# Patient Record
Sex: Male | Born: 1980 | Race: White | Hispanic: No | Marital: Single | Smoking: Never smoker
Health system: Southern US, Community
[De-identification: ages and names within clinical notes are randomized; demographics above are authoritative.]

## PROBLEM LIST (undated history)

## (undated) DIAGNOSIS — F32A Depression, unspecified: Secondary | ICD-10-CM

## (undated) DIAGNOSIS — G4730 Sleep apnea, unspecified: Secondary | ICD-10-CM

## (undated) DIAGNOSIS — F329 Major depressive disorder, single episode, unspecified: Secondary | ICD-10-CM

## (undated) HISTORY — DX: Depression, unspecified: F32.A

## (undated) HISTORY — DX: Major depressive disorder, single episode, unspecified: F32.9

---

## 2009-09-04 ENCOUNTER — Emergency Department: Payer: Self-pay | Admitting: Emergency Medicine

## 2010-10-02 ENCOUNTER — Emergency Department (HOSPITAL_COMMUNITY): Admission: EM | Admit: 2010-10-02 | Discharge: 2010-10-02 | Payer: Self-pay | Admitting: Emergency Medicine

## 2010-10-06 ENCOUNTER — Observation Stay: Payer: Self-pay | Admitting: Internal Medicine

## 2016-08-24 ENCOUNTER — Ambulatory Visit (INDEPENDENT_AMBULATORY_CARE_PROVIDER_SITE_OTHER): Payer: BLUE CROSS/BLUE SHIELD | Admitting: Primary Care

## 2016-08-24 ENCOUNTER — Encounter: Payer: Self-pay | Admitting: Primary Care

## 2016-08-24 VITALS — BP 122/74 | HR 48 | Temp 97.9°F | Ht 69.75 in | Wt 183.1 lb

## 2016-08-24 DIAGNOSIS — R202 Paresthesia of skin: Secondary | ICD-10-CM | POA: Diagnosis not present

## 2016-08-24 DIAGNOSIS — R0683 Snoring: Secondary | ICD-10-CM

## 2016-08-24 LAB — CBC
HCT: 43.4 % (ref 39.0–52.0)
Hemoglobin: 14.8 g/dL (ref 13.0–17.0)
MCHC: 34.2 g/dL (ref 30.0–36.0)
MCV: 87.6 fl (ref 78.0–100.0)
Platelets: 219 10*3/uL (ref 150.0–400.0)
RBC: 4.95 Mil/uL (ref 4.22–5.81)
RDW: 13.4 % (ref 11.5–15.5)
WBC: 4.9 10*3/uL (ref 4.0–10.5)

## 2016-08-24 LAB — BASIC METABOLIC PANEL
BUN: 13 mg/dL (ref 6–23)
CALCIUM: 8.8 mg/dL (ref 8.4–10.5)
CHLORIDE: 105 meq/L (ref 96–112)
CO2: 27 meq/L (ref 19–32)
Creatinine, Ser: 1.05 mg/dL (ref 0.40–1.50)
GFR: 86.31 mL/min (ref >60.00)
Glucose, Bld: 84 mg/dL (ref 70–99)
Potassium: 3.9 mEq/L (ref 3.5–5.1)
SODIUM: 139 meq/L (ref 135–145)

## 2016-08-24 LAB — TSH: TSH: 2.11 u[IU]/mL (ref 0.35–4.50)

## 2016-08-24 LAB — VITAMIN B12: VITAMIN B 12: 357 pg/mL (ref 211–911)

## 2016-08-24 LAB — HEMOGLOBIN A1C: Hgb A1c MFr Bld: 5.6 % (ref 4.6–6.5)

## 2016-08-24 LAB — FOLATE: Folate: 12 ng/mL (ref >5.9)

## 2016-08-24 NOTE — Progress Notes (Signed)
Pre visit review using our clinic review tool, if applicable. No additional management support is needed unless otherwise documented below in the visit note. 

## 2016-08-24 NOTE — Progress Notes (Signed)
Subjective:    Patient ID: Vincent Harris, male    DOB: 10-28-1981, 35 y.o.   MRN: [id number]  HPI  Vincent Harris is a 35 year old male who presents today to establish care and discuss the problems mentioned below. Will obtain old records.  1) Tingling: His tingling is located to his fingertips and toes. History of tingling to the 4th and 5th digit of bilateral hands for years that will occur if he leans on his elbows for a prolonged period of time. This past Saturday he noticed tingling to his toes, and Monday morning he noticed noticed tingling to all of his finger tips. Denies discoloration of fingers/toes, neck pain, shoulder pain, numbness, recent injury/trauma. He exercises with Crossfit 5 days weekly, and has been doing so for the past 5 years. He has a long history of his finger tips and toes being much cooler than the rest of his body.  2) Snoring: Long history of snoring, occasionally wakes during the night, and never wakes up feeling refreshed. He doesn't ever feel like he falls completely asleep. He goes to bed between 11pm-1 am and will wake up between 6:30am-7:30am. He does not watch TV, read, or play on his phone prior to bed. He symptoms of anxiety and depression. He has a strong FH of sleep apnea in his grandfather, father, and brother.   Review of Systems  Respiratory: Negative for shortness of breath.   Musculoskeletal: Negative for back pain, joint swelling, myalgias and neck pain.  Neurological: Negative for numbness.       Tingling to finger tips and toes  Psychiatric/Behavioral: Positive for sleep disturbance. The patient is not nervous/anxious.        Past Medical History:  Diagnosis Date  . Depression      Social History   Social History  . Marital status: Married    Spouse name: N/A  . Number of children: N/A  . Years of education: N/A   Occupational History  . Not on file.   Social History Main Topics  . Smoking status: Never Smoker  . Smokeless  tobacco: Never Used  . Alcohol use Yes     Comment: soical  . Drug use: Unknown  . Sexual activity: Not on file   Other Topics Concern  . Not on file   Social History Narrative   Married.   2 daughters.   Self Employed.   Enjoys racing cars, spending time with family.    No past surgical history on file.  Family History  Problem Relation Age of Onset  . Mental illness Mother   . Diabetes Father   . Dementia Father   . Sleep apnea Father   . Arthritis Maternal Grandmother   . Arthritis Maternal Grandfather   . Diabetes Paternal Grandmother   . Diabetes Paternal Grandfather     Allergies  Allergen Reactions  . Percocet [Oxycodone-Acetaminophen] Other (See Comments)    seizure    No current outpatient prescriptions on file prior to visit.   No current facility-administered medications on file prior to visit.     BP 122/74   Pulse (!) 48   Temp 97.9 F (36.6 C) (Oral)   Ht 5' 9.75" (1.772 m)   Wt 183 lb 1.9 oz (83.1 kg)   SpO2 98%   BMI 26.46 kg/m    Objective:   Physical Exam  Constitutional: He is oriented to person, place, and time. He appears well-nourished.  Neck: Neck supple.  Cardiovascular: Normal rate  and regular rhythm.   Pulmonary/Chest: Effort normal and breath sounds normal. He has no wheezes. He has no rales.  Neurological: He is alert and oriented to person, place, and time.  Skin: Skin is warm and dry.  Psychiatric: He has a normal mood and affect.          Assessment & Plan:

## 2016-08-24 NOTE — Assessment & Plan Note (Signed)
Long history of snoring, wakes during the night, doesn't feel as he's slept most of the night.  ESS scale of 8 today. Doesn't fit the clinical picture of sleep apnea, however given family history and patient persistentcy for sleep study, will order. Referral placed to pulmonology for further evaluation.

## 2016-08-24 NOTE — Assessment & Plan Note (Signed)
To fingertips and toes x 1 week. Exam with cool feeling finger tips and toes, no discoloration. Suspect circulatory cause for tingling and cool temperatures, but will rule out other causes with lab work. Information provided regarding Raynaud's Phenomenon.

## 2016-08-24 NOTE — Patient Instructions (Signed)
Complete lab work prior to leaving today. I will notify you of your results once received.   You will be contacted regarding your referral to Pulmonology regarding the sleep study.  Please let us know if you have not heard back within one week.   It was a pleasure to meet you today! Please don't hesitate to call me with any questions. Welcome to [facility]!  Raynaud Phenomenon Raynaud phenomenon is a condition that affects the blood vessels (arteries) that carry blood to your fingers and toes. The arteries that supply blood to your ears or the tip of your nose might also be affected. Raynaud phenomenon causes the arteries to temporarily narrow. As a result, the flow of blood to the affected areas is temporarily decreased. This usually occurs in response to cold temperatures or stress. During an attack, the skin in the affected areas turns white. You may also feel tingling or numbness in those areas. Attacks usually last for only a brief period, and then the blood flow to the area returns to normal. In most cases, Raynaud phenomenon does not cause serious health problems. CAUSES  For many people with this condition, the cause is not known. Raynaud phenomenon is sometimes associated with other diseases, such as scleroderma or lupus. RISK FACTORS Raynaud phenomenon can affect anyone, but it develops most often in people who are 28-65 years old. It affects more females than males. SIGNS AND SYMPTOMS Symptoms of Raynaud phenomenon may occur when you are exposed to cold temperatures or when you have emotional stress. The symptoms may last for a few minutes or up to several hours. They usually affect your fingers but may also affect your toes, ears, or the tip of your nose. Symptoms may include:  Changes in skin color. The skin in the affected areas will turn pale or white. The skin may then change from white to bluish to red as normal blood flow returns to the area.  Numbness, tingling, or pain in the  affected areas. In severe cases, sores may develop in the affected areas.  DIAGNOSIS  Your health care provider will do a physical exam and take your medical history. You may be asked to put your hands in cold water to check for a reaction to cold temperature. Blood tests may be done to check for other diseases or conditions. Your health care provider may also order a test to check the movement of blood through your arteries and veins (vascular ultrasound). TREATMENT  Treatment often involves making lifestyle changes and taking steps to control your exposure to cold temperatures. For more severe cases, medicine (calcium channel blockers) may be used to improve blood flow. Surgery is sometimes done to block the nerves that control the affected arteries, but this is rare. HOME CARE INSTRUCTIONS   Avoid exposure to cold by taking these steps:  If possible, stay indoors during cold weather.  When you go outside during cold weather, dress in layers and wear mittens, a hat, a scarf, and warm footwear.  Wear mittens or gloves when handling ice or frozen food.  Use holders for glasses or cans containing cold drinks.  Let warm water run for a while before taking a shower or bath.  Warm up the car before driving in cold weather.  If possible, avoid stressful and emotional situations. Exercise, meditation, and yoga may help you cope with stress. Biofeedback may be useful.  Do not use any tobacco products, including cigarettes, chewing tobacco, or electronic cigarettes. If you need help quitting,  ask your health care provider.  Avoid secondhand smoke.  Limit your use of caffeine. Switch to decaffeinated coffee, tea, and soda. Avoid chocolate.  Wear loose fitting socks and comfortable, roomy shoes.  Avoid vibrating tools and machinery.  Take medicines only as directed by your health care provider. SEEK MEDICAL CARE IF:   Your discomfort becomes worse despite lifestyle changes.  You develop  sores on your fingers or toes that do not heal.  Your fingers or toes turn black.  You have breaks in the skin on your fingers or toes.  You have a fever.  You have pain or swelling in your joints.  You have a rash.  Your symptoms occur on only one side of your body.   This information is not intended to replace advice given to you by your health care provider. Make sure you discuss any questions you have with your health care provider.   Document Released: 11/24/2000 Document Revised: 12/18/2014 Document Reviewed: 07/02/2014 Elsevier Interactive Patient Education [employer].

## 2016-08-25 ENCOUNTER — Ambulatory Visit (INDEPENDENT_AMBULATORY_CARE_PROVIDER_SITE_OTHER): Payer: BLUE CROSS/BLUE SHIELD | Admitting: Pulmonary Disease

## 2016-08-25 ENCOUNTER — Encounter: Payer: Self-pay | Admitting: Pulmonary Disease

## 2016-08-25 VITALS — BP 138/70 | HR 65 | Ht 70.0 in | Wt 187.0 lb

## 2016-08-25 DIAGNOSIS — G4710 Hypersomnia, unspecified: Secondary | ICD-10-CM | POA: Diagnosis not present

## 2016-08-25 DIAGNOSIS — R0683 Snoring: Secondary | ICD-10-CM

## 2016-08-25 DIAGNOSIS — G4733 Obstructive sleep apnea (adult) (pediatric): Secondary | ICD-10-CM

## 2016-08-27 NOTE — Progress Notes (Signed)
PULMONARY/SLEEP CONSULT NOTE  Requesting MD/Service: Alma Friendly, NP Date of initial consultation: 08/25/16 Reason for consultation: Suspected OSA  PT PROFILE: 89 M referred for evaluation of daytime sleepiness and heavy snoring with witnessed apneas.   HPI:  Symptoms as above. His wife has expressed concern over witnessed apneas. He scores 11 on the Epworth Scale. His current weight is 185# down from a peak weight of 210# 5 yrs ago. He denies nasal and sinus symptoms   Past Medical History:  Diagnosis Date  . Depression     History reviewed. No pertinent surgical history.  MEDICATIONS: I have reviewed all medications and confirmed regimen as documented  Social History   Social History  . Marital status: Married    Spouse name: N/A  . Number of children: N/A  . Years of education: N/A   Occupational History  . Not on file.   Social History Main Topics  . Smoking status: Never Smoker  . Smokeless tobacco: Never Used  . Alcohol use Yes     Comment: soical  . Drug use: Unknown  . Sexual activity: Not on file   Other Topics Concern  . Not on file   Social History Narrative   Married.   2 daughters.   Self Employed.   Enjoys racing cars, spending time with family.    Family History  Problem Relation Age of Onset  . Mental illness Mother   . Diabetes Father   . Dementia Father   . Sleep apnea Father   . Arthritis Maternal Grandmother   . Arthritis Maternal Grandfather   . Diabetes Paternal Grandmother   . Diabetes Paternal Grandfather     ROS: No fever, myalgias/arthralgias, unexplained weight loss or weight gain No new focal weakness or sensory deficits No otalgia, hearing loss, visual changes, nasal and sinus symptoms, mouth and throat problems No neck pain or adenopathy No abdominal pain, N/V/D, diarrhea, change in bowel pattern No dysuria, change in urinary pattern   Vitals:   08/25/16 0918  BP: 138/70  Pulse: 65  SpO2: 98%  Weight: 187  lb (84.8 kg)  Height: 5\' 10"  (1.778 m)     EXAM:  Gen: WDWN, No overt respiratory distress HEENT: NCAT, sclera white, oropharynx normal Neck: Supple without LAN, thyromegaly, JVD Lungs: breath sounds: full, percussion: normal, No adventitious sounds Cardiovascular: RRR, no murmurs noted Abdomen: Soft, nontender, normal BS Ext: without clubbing, cyanosis, edema Neuro: CNs grossly intact, motor and sensory intact Skin: Limited exam, no lesions noted  DATA:   BMP Latest Ref Rng & Units 08/24/2016  Glucose 70 - 99 mg/dL 84  BUN 6 - 23 mg/dL 13  Creatinine 0.40 - 1.50 mg/dL 1.05  Sodium 135 - 145 mEq/L 139  Potassium 3.5 - 5.1 mEq/L 3.9  Chloride 96 - 112 mEq/L 105  CO2 19 - 32 mEq/L 27  Calcium 8.4 - 10.5 mg/dL 8.8    CBC Latest Ref Rng & Units 08/24/2016  WBC 4.0 - 10.5 K/uL 4.9  Hemoglobin 13.0 - 17.0 g/dL 14.8  Hematocrit 39.0 - 52.0 % 43.4  Platelets 150.0 - 400.0 K/uL 219.0    CXR:  None available  IMPRESSION:     ICD-9-CM ICD-10-CM   1. Snoring 786.09 R06.83 Split night study  2. Hypersomnolence 780.54 G47.10 Split night study  3. OSA suspected 327.23 G47.33      PLAN:  Split night sleep study ordered ROV 6 weeks   Merton Border, MD PCCM service Mobile [phone] Pager [phone] 08/27/2016

## 2016-08-28 ENCOUNTER — Other Ambulatory Visit: Payer: Self-pay | Admitting: Primary Care

## 2016-08-28 ENCOUNTER — Telehealth: Payer: Self-pay

## 2016-08-28 DIAGNOSIS — R202 Paresthesia of skin: Secondary | ICD-10-CM

## 2016-08-28 NOTE — Telephone Encounter (Signed)
PLEASE NOTE: All timestamps contained within this report are represented as [place] Standard Time. CONFIDENTIALTY NOTICE: This fax transmission is intended only for the addressee. It contains information that is legally privileged, confidential or otherwise protected from use or disclosure. If you are not the intended recipient, you are strictly prohibited from reviewing, disclosing, copying using or disseminating any of this information or taking any action in reliance on or regarding this information. If you have received this fax in error, please notify us immediately by telephone so that we can arrange for its return to [place]. Phone: [phone], Toll-Free: [phone], Fax: [phone] Page: 1 of 1 Call Id: [id number] [place] Night - Client Nonclinical Telephone Record [place] Night - Client Client Site [place] - Night Contact Type Call Who Is Calling Patient / Member / Family / Caregiver Caller Name Will Yahye Isobe Phone Number [phone] Call Type Message Only Information Provided Reason for Call Returning a Call from the Office Initial Comment Caller States calling for Tammy, calling office back Additional Comment Call Closed By: Almon Register Transaction Date/Time: 08/25/2016 5:03:07 PM (ET)

## 2016-08-28 NOTE — Telephone Encounter (Signed)
Spoken and notified patient of Kate's comments. Patient verbalized understanding. 

## 2016-09-07 ENCOUNTER — Ambulatory Visit: Payer: Self-pay | Admitting: Internal Medicine

## 2016-09-28 ENCOUNTER — Ambulatory Visit: Payer: BLUE CROSS/BLUE SHIELD | Attending: Internal Medicine

## 2016-09-28 ENCOUNTER — Encounter: Payer: Self-pay | Admitting: Neurology

## 2016-09-28 ENCOUNTER — Ambulatory Visit (INDEPENDENT_AMBULATORY_CARE_PROVIDER_SITE_OTHER): Payer: BLUE CROSS/BLUE SHIELD | Admitting: Neurology

## 2016-09-28 VITALS — BP 118/84 | HR 72 | Resp 16 | Ht 70.0 in | Wt 189.0 lb

## 2016-09-28 DIAGNOSIS — Z87898 Personal history of other specified conditions: Secondary | ICD-10-CM | POA: Diagnosis not present

## 2016-09-28 DIAGNOSIS — G4710 Hypersomnia, unspecified: Secondary | ICD-10-CM | POA: Insufficient documentation

## 2016-09-28 DIAGNOSIS — R0683 Snoring: Secondary | ICD-10-CM

## 2016-09-28 DIAGNOSIS — R202 Paresthesia of skin: Secondary | ICD-10-CM | POA: Diagnosis not present
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
## 2016-09-29 DIAGNOSIS — G4710 Hypersomnia, unspecified: Secondary | ICD-10-CM | POA: Diagnosis not present

## 2016-10-05 ENCOUNTER — Telehealth: Payer: Self-pay | Admitting: *Deleted

## 2016-10-05 NOTE — Telephone Encounter (Signed)
I called pt and informed him that his sleep study was negative (in your folder to sign). Pt states he is still snoring and staying tired. Please advise.

## 2016-10-06 ENCOUNTER — Telehealth: Payer: Self-pay | Admitting: Neurology

## 2016-10-06 NOTE — Telephone Encounter (Signed)
Schedule him for follow up with me or DR  Waunita Schooner

## 2016-10-06 NOTE — Telephone Encounter (Signed)
[employer] approved the mri brain w/wo but is requiring a p2p for the cervical. Please call [phone] within the next two business days patients ID number is [id number].

## 2016-10-09 ENCOUNTER — Telehealth: Payer: Self-pay | Admitting: Neurology

## 2016-10-09 NOTE — Telephone Encounter (Signed)
Pt called to schedule MRI-he was advised they were waiting on auth from insurance. Please call

## 2016-10-09 NOTE — Telephone Encounter (Signed)
appt scheduled.  Nothing further needed.  

## 2016-10-09 NOTE — Telephone Encounter (Signed)
The approval number is [id number]

## 2016-10-10 NOTE — Telephone Encounter (Signed)
Spoke with patient today who called again, scheduled his apt.

## 2016-10-13 ENCOUNTER — Encounter: Payer: Self-pay | Admitting: Pulmonary Disease

## 2016-10-13 ENCOUNTER — Ambulatory Visit (INDEPENDENT_AMBULATORY_CARE_PROVIDER_SITE_OTHER): Payer: BLUE CROSS/BLUE SHIELD | Admitting: Pulmonary Disease

## 2016-10-13 VITALS — BP 110/58 | HR 52 | Wt 190.0 lb

## 2016-10-13 DIAGNOSIS — R0683 Snoring: Secondary | ICD-10-CM | POA: Diagnosis not present

## 2016-10-13 DIAGNOSIS — G4719 Other hypersomnia: Secondary | ICD-10-CM | POA: Diagnosis not present

## 2016-10-13 MED ORDER — FLUTICASONE PROPIONATE 50 MCG/ACT NA SUSP: 2.0000 | Freq: Every day | NASAL | 2 refills | Status: DC

## 2016-10-13 NOTE — Patient Instructions (Addendum)
Trial of Flonase nasal inhaler - 2 sprays per nostril each day  Trial of chin strap or any other over-the-counter device  Call in 2 weeks if snoring and daytime sleepiness are not improved and I will make referral to ENT medicine

## 2016-10-15 NOTE — Progress Notes (Signed)
PULMONARY/SLEEP FOLLOW UP NOTE  Requesting MD/Service: Alma Friendly, NP Date of initial consultation: 08/25/16 Reason for consultation: Suspected OSA  PT PROFILE: 36 M referred for evaluation of daytime sleepiness and heavy snoring with witnessed apneas.   DATA: PSG 09/28/16: AHI 2.9/hr  SUBJ: Here to review results of PSG. Continues to have excessive daytime sleepiness and snoring that disrupts his wife's sleep  OBJ:  Vitals:   10/13/16 1045  BP: (!) 110/58  Pulse: (!) 52  SpO2: 99%  Weight: 190 lb (86.2 kg)     EXAM:  Gen: NAD HEENT: NCAT, oropharynx normal, nares normal Neck: Supple without LAN, thyromegaly, JVD Lungs: CTAP Cardiovascular: RRR, no murmurs noted Abdomen: Soft, nontender, normal BS Ext: without clubbing, cyanosis, edema Neuro:grossly intact  DATA:   IMPRESSION:     ICD-9-CM ICD-10-CM   1. Snoring 786.09 R06.83   2. Daytime hypersomnolence 780.54 G47.19    His PSG does not support the diagnosis of OSA. He might have UARS. There does not appear to be an indication for CPAP.   PLAN:  We discussed other therapy options for snoring and excessive sleepiness including attention to sleep hygiene and non-prescription snoring devices - mouthpieces and chin straps. He already owns a chin strap that he doesn't use consistently. We discussed body positioning (sleeping on back or side). We discussed avoidance of alcohol and other sedatives. His body weight is not excessive so it is unlikely that weight loss has much of a role here. There does not seem to be an obvious anatomic cause for snoring. Nonetheless, if the above measures are not effective, I have offered to make a referral for consideration of ENT interventions for snoring. I have not scheduled follow up with him but would be happy to see him at any time in the future as he desires or as deemed appropriate by his other care providers.  Merton Border, MD PCCM service Mobile [phone] Pager  [phone] 10/15/2016

## 2016-10-25 ENCOUNTER — Ambulatory Visit (INDEPENDENT_AMBULATORY_CARE_PROVIDER_SITE_OTHER): Payer: BLUE CROSS/BLUE SHIELD

## 2016-10-25 DIAGNOSIS — R202 Paresthesia of skin: Secondary | ICD-10-CM | POA: Diagnosis not present

## 2016-10-25 DIAGNOSIS — Z87898 Personal history of other specified conditions: Secondary | ICD-10-CM

## 2016-10-26 MED ORDER — GADOPENTETATE DIMEGLUMINE 469.01 MG/ML IV SOLN: 17.0000 mL | Freq: Once | INTRAVENOUS | Status: AC | PRN

## 2016-10-27 ENCOUNTER — Telehealth: Payer: Self-pay | Admitting: *Deleted

## 2016-10-27 NOTE — Telephone Encounter (Signed)
I have spoken with Vincent Harris this morning and per RAS, advised that MRI brain is normal.  MRI C-spine shows just one mild disc bulge at C4-5 that is not pressing on any nerves or the spinal cord.  He verbalized understanding of same/fim

## 2016-10-27 NOTE — Telephone Encounter (Signed)
-----   Message from Britt Bottom, MD sent at 10/27/2016  8:58 AM EST ----- Please let him know that the MRI of the brain is normal. The MRI of the cervical spine just showed mild disc bulge at C4C5 level that does not lead to any nerve root or spinal cord compression.

## 2017-05-09 ENCOUNTER — Encounter: Payer: Self-pay | Admitting: Neurology

## 2017-05-10 ENCOUNTER — Encounter: Payer: Self-pay | Admitting: Primary Care

## 2017-05-10 ENCOUNTER — Ambulatory Visit (INDEPENDENT_AMBULATORY_CARE_PROVIDER_SITE_OTHER): Payer: Self-pay | Admitting: Primary Care

## 2017-05-10 DIAGNOSIS — R200 Anesthesia of skin: Secondary | ICD-10-CM

## 2017-05-10 DIAGNOSIS — R202 Paresthesia of skin: Principal | ICD-10-CM

## 2017-05-10 LAB — CBC
HCT: 42.8 % (ref 39.0–52.0)
Hemoglobin: 14.3 g/dL (ref 13.0–17.0)
MCHC: 33.3 g/dL (ref 30.0–36.0)
MCV: 89.8 fl (ref 78.0–100.0)
PLATELETS: 225 10*3/uL (ref 150.0–400.0)
RBC: 4.77 Mil/uL (ref 4.22–5.81)
RDW: 13.9 % (ref 11.5–15.5)
WBC: 4.8 10*3/uL (ref 4.0–10.5)

## 2017-05-10 LAB — TSH: TSH: 2.39 u[IU]/mL (ref 0.35–4.50)

## 2017-05-10 LAB — BASIC METABOLIC PANEL
BUN: 13 mg/dL (ref 6–23)
CHLORIDE: 106 meq/L (ref 96–112)
CO2: 28 meq/L (ref 19–32)
Calcium: 9.1 mg/dL (ref 8.4–10.5)
Creatinine, Ser: 1.01 mg/dL (ref 0.40–1.50)
GFR: 89.88 mL/min (ref >60.00)
GLUCOSE: 91 mg/dL (ref 70–99)
Potassium: 3.7 mEq/L (ref 3.5–5.1)
SODIUM: 140 meq/L (ref 135–145)

## 2017-05-10 LAB — FOLATE: FOLATE: 10.9 ng/mL (ref >5.9)

## 2017-05-10 LAB — VITAMIN B12: Vitamin B-12: 314 pg/mL (ref 211–911)

## 2017-05-10 NOTE — Progress Notes (Signed)
   Subjective:    Patient ID: Vincent Harris, male    DOB: 1981-01-28, 36 y.o.   MRN: [id number]  HPI  Vincent Harris is a 36 year old male who presents today with a chief complaint of tingling. His tingling initially began to the fingers and toes is located to the lower part of his bilateral lower extremities and bilateral forearms. He describes the tingling like a "mild sunburn". His tingling began about four days ago. He denies changes in his diet, extreme exercise, cold sensation to his extremities, calf swelling, rash, weakness. He does spend a lot of time outdoors, last tick bite being three weeks ago. 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 He's been reading about carbon monoxide exposure.  Review of Systems  Constitutional: Negative for chills, fatigue and fever.  HENT: Negative for congestion.   Respiratory: Negative for cough and shortness of breath.   Cardiovascular: Negative for chest pain.  Musculoskeletal: Negative for arthralgias.  Skin: Negative for rash.  Neurological: Negative for weakness.       Past Medical History:  Diagnosis Date  . Depression      Social History   Social History  . Marital status: Married    Spouse name: N/A  . Number of children: N/A  . Years of education: N/A   Occupational History  . Not on file.   Social History Main Topics  . Smoking status: Never Smoker  . Smokeless tobacco: Never Used  . Alcohol use Yes     Comment: soical  . Drug use: Unknown  . Sexual activity: Not on file   Other Topics Concern  . Not on file   Social History Narrative   Married.   2 daughters.   Self Employed.   Enjoys racing cars, spending time with family.    No past surgical history on file.  Family History  Problem Relation Age of Onset  . Mental illness Mother   . Diabetes Father   . Dementia Father   . Sleep apnea Father   . Arthritis Maternal Grandmother   . Arthritis Maternal Grandfather   . Diabetes Paternal  Grandmother   . Diabetes Paternal Grandfather     Allergies  Allergen Reactions  . Percocet [Oxycodone-Acetaminophen] Other (See Comments)    seizure    Current Outpatient Prescriptions on File Prior to Visit  Medication Sig Dispense Refill  . fluticasone (FLONASE) 50 MCG/ACT nasal spray Place 2 sprays into both nostrils daily. (Patient not taking: Reported on 05/10/2017) 16 g 2   Current Facility-Administered Medications on File Prior to Visit  Medication Dose Route Frequency Provider Last Rate Last Dose  . gadopentetate dimeglumine (MAGNEVIST) injection 17 mL  17 mL Intravenous Once PRN Sater, Nanine Means, MD        BP 126/82   Pulse (!) 54   Temp 98.4 F (36.9 C) (Oral)   Ht 5\' 10"  (1.778 m)   Wt 190 lb 12.8 oz (86.5 kg)   SpO2 99%   BMI 27.38 kg/m    Objective:   Physical Exam  Constitutional: He appears well-nourished.  Neck: Neck supple.  Cardiovascular: Normal rate and regular rhythm.   Pulmonary/Chest: Effort normal and breath sounds normal.  Musculoskeletal: Normal range of motion.  Neurological: He has normal reflexes. No cranial nerve deficit.  Skin: Skin is warm and dry. No rash noted.          Assessment & Plan:

## 2017-05-10 NOTE — Assessment & Plan Note (Signed)
To bilateral latera lower extremities below knees and bilateral posterior forearms. Exam today unremarkable. Unsure of etiology. Will repeat/add in labs today. If labs unremarkable, consider EMG testing.

## 2017-05-10 NOTE — Patient Instructions (Signed)
Complete lab work prior to leaving today. I will notify you of your results once received.   Please notify me if no improvement in 1-2 weeks.  It was a pleasure to see you today!

## 2017-05-11 LAB — LYME AB/WESTERN BLOT REFLEX

## 2017-05-14 ENCOUNTER — Other Ambulatory Visit (INDEPENDENT_AMBULATORY_CARE_PROVIDER_SITE_OTHER): Payer: Self-pay

## 2017-05-14 DIAGNOSIS — R202 Paresthesia of skin: Secondary | ICD-10-CM

## 2017-05-14 DIAGNOSIS — R200 Anesthesia of skin: Secondary | ICD-10-CM

## 2017-05-14 LAB — HEPATIC FUNCTION PANEL
ALT: 11 U/L (ref 0–53)
AST: 14 U/L (ref 0–37)
Albumin: 4.6 g/dL (ref 3.5–5.2)
Alkaline Phosphatase: 42 U/L (ref 39–117)
BILIRUBIN DIRECT: 0.1 mg/dL (ref 0.0–0.3)
BILIRUBIN TOTAL: 0.6 mg/dL (ref 0.2–1.2)
Total Protein: 7.2 g/dL (ref 6.0–8.3)

## 2017-05-15 ENCOUNTER — Ambulatory Visit: Payer: BLUE CROSS/BLUE SHIELD | Admitting: Primary Care

## 2017-05-15 LAB — HEPATITIS C ANTIBODY: HCV AB: NEGATIVE

## 2017-05-16 LAB — PROTEIN ELECTROPHORESIS, SERUM
ALBUMIN ELP: 4.4 g/dL (ref 3.8–4.8)
Alpha-1-Globulin: 0.2 g/dL (ref 0.2–0.3)
Alpha-2-Globulin: 0.6 g/dL (ref 0.5–0.9)
BETA 2: 0.4 g/dL (ref 0.2–0.5)
BETA GLOBULIN: 0.4 g/dL (ref 0.4–0.6)
Gamma Globulin: 1 g/dL (ref 0.8–1.7)
TOTAL PROTEIN, SERUM ELECTROPHOR: 6.9 g/dL (ref 6.1–8.1)

## 2017-05-17 LAB — CARBOXYHEMOGLOBIN: CARBOXYHEMOGLOBIN: 4 %{Hb} (ref <=12)

## 2017-05-20 ENCOUNTER — Other Ambulatory Visit: Payer: Self-pay | Admitting: Primary Care

## 2017-07-02 ENCOUNTER — Encounter: Payer: Self-pay | Admitting: Primary Care

## 2018-08-30 DIAGNOSIS — C4492 Squamous cell carcinoma of skin, unspecified: Secondary | ICD-10-CM

## 2018-08-30 HISTORY — DX: Squamous cell carcinoma of skin, unspecified: C44.92

## 2020-04-06 ENCOUNTER — Other Ambulatory Visit: Payer: Self-pay | Admitting: Urology

## 2020-04-06 DIAGNOSIS — R310 Gross hematuria: Secondary | ICD-10-CM

## 2020-04-13 ENCOUNTER — Other Ambulatory Visit: Payer: Self-pay

## 2020-04-13 ENCOUNTER — Ambulatory Visit
Admission: RE | Admit: 2020-04-13 | Discharge: 2020-04-13 | Disposition: A | Discharge status: Home / Self Care | Payer: Self-pay | Source: Ambulatory Visit | Attending: Urology | Admitting: Urology

## 2020-04-13 DIAGNOSIS — R310 Gross hematuria: Secondary | ICD-10-CM | POA: Insufficient documentation

## 2020-04-13 MED ORDER — IOHEXOL 300 MG/ML  SOLN
150.0000 mL | Freq: Once | INTRAMUSCULAR | Status: AC | PRN
  Administered 2020-04-13: 08:00:00 150 mL via INTRAVENOUS

## 2020-11-01 ENCOUNTER — Other Ambulatory Visit: Payer: Self-pay

## 2020-11-01 ENCOUNTER — Ambulatory Visit (INDEPENDENT_AMBULATORY_CARE_PROVIDER_SITE_OTHER): Payer: 59 | Admitting: Dermatology

## 2020-11-01 DIAGNOSIS — L821 Other seborrheic keratosis: Secondary | ICD-10-CM

## 2020-11-01 DIAGNOSIS — Z1283 Encounter for screening for malignant neoplasm of skin: Secondary | ICD-10-CM | POA: Diagnosis not present

## 2020-11-01 DIAGNOSIS — D171 Benign lipomatous neoplasm of skin and subcutaneous tissue of trunk: Secondary | ICD-10-CM | POA: Diagnosis not present

## 2020-11-01 DIAGNOSIS — L814 Other melanin hyperpigmentation: Secondary | ICD-10-CM

## 2020-11-01 DIAGNOSIS — Z85828 Personal history of other malignant neoplasm of skin: Secondary | ICD-10-CM | POA: Diagnosis not present

## 2020-11-01 DIAGNOSIS — D229 Melanocytic nevi, unspecified: Secondary | ICD-10-CM

## 2020-11-01 DIAGNOSIS — L578 Other skin changes due to chronic exposure to nonionizing radiation: Secondary | ICD-10-CM

## 2020-11-01 DIAGNOSIS — D1800 Hemangioma unspecified site: Secondary | ICD-10-CM

## 2020-11-01 NOTE — Progress Notes (Signed)
   Follow-Up Visit   Subjective  Vincent Harris is a 39 y.o. male who presents for the following: Other (Spot of back x more than a year. Lipomas of chest that he would like checked also.). The patient presents for Upper Body Skin Exam (UBSE) for skin cancer screening and mole check.  The following portions of the chart were reviewed this encounter and updated as appropriate:  Tobacco  Allergies  Meds  Problems  Med Hx  Surg Hx  Fam Hx     Review of Systems:  No other skin or systemic complaints except as noted in HPI or Assessment and Plan.  Objective  Well appearing patient in no apparent distress; mood and affect are within normal limits.  All skin waist up examined.  Objective  Right Upper Back: Stuck-on, waxy, tan-brown papule or plaque --Discussed benign etiology and prognosis.   Objective  Left Abdomen (side) - Upper: 0.6 cm rubbery nodule of left costal area  1.0 cm rubbery nodule of left costal area  1.5 cm rubbery nodule of left medial inf pectoral  0.6 cm rubbery nodule of left lat costal area  1.2 cm rubbery nodule of left lat pectoral  0.6 cm rubbery nodule of left low back  Objective  Right central upper forehead: Well healed scar.   Assessment & Plan    Lentigines - Scattered tan macules - Discussed due to sun exposure - Benign, observe - Call for any changes  Seborrheic Keratoses - Stuck-on, waxy, tan-brown papules and plaques  - Discussed benign etiology and prognosis. - Observe - Call for any changes  Melanocytic Nevi - Tan-brown and/or pink-flesh-colored symmetric macules and papules - Benign appearing on exam today - Observation - Call clinic for new or changing moles - Recommend daily use of broad spectrum spf 30+ sunscreen to sun-exposed areas.   Hemangiomas - Red papules - Discussed benign nature - Observe - Call for any changes  Actinic Damage - Chronic, secondary to cumulative UV/sun exposure - diffuse scaly  erythematous macules with underlying dyspigmentation - Recommend daily broad spectrum sunscreen SPF 30+ to sun-exposed areas, reapply every 2 hours as needed.  - Call for new or changing lesions.  Skin cancer screening performed today.  Seborrheic keratosis Right Upper Back  Benign, observe.    Lipoma of torso Left Abdomen (side) - Upper  Benign. Discussed excision if any of them are growing or become symptomatic. None are symptomatic at this time.  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 Observe for recurrence. Call clinic for new or changing lesions.  Recommend regular skin exams, daily broad-spectrum spf 30+ sunscreen use, and photoprotection.     Skin cancer screening  Return in about 1 year (around 11/01/2021).  I, Ashok Cordia, CMA, am acting as scribe for Sarina Ser, MD .  Documentation: I have reviewed the above documentation for accuracy and completeness, and I agree with the above.  Sarina Ser, MD

## 2020-11-09 ENCOUNTER — Encounter: Payer: Self-pay | Admitting: Dermatology

## 2020-11-25 ENCOUNTER — Other Ambulatory Visit: Payer: Self-pay

## 2020-11-25 ENCOUNTER — Telehealth: Payer: Self-pay | Admitting: Emergency Medicine

## 2020-11-25 ENCOUNTER — Emergency Department: Payer: 59

## 2020-11-25 ENCOUNTER — Emergency Department
Admission: EM | Admit: 2020-11-25 | Discharge: 2020-11-25 | Disposition: A | Discharge status: Home / Self Care | Payer: 59 | Attending: Student in an Organized Health Care Education/Training Program | Admitting: Student in an Organized Health Care Education/Training Program

## 2020-11-25 ENCOUNTER — Encounter: Payer: Self-pay | Admitting: *Deleted

## 2020-11-25 DIAGNOSIS — R1084 Generalized abdominal pain: Secondary | ICD-10-CM | POA: Diagnosis not present

## 2020-11-25 DIAGNOSIS — Z85828 Personal history of other malignant neoplasm of skin: Secondary | ICD-10-CM | POA: Insufficient documentation

## 2020-11-25 LAB — URINALYSIS, COMPLETE (UACMP) WITH MICROSCOPIC
Bacteria, UA: NONE SEEN
Bilirubin Urine: NEGATIVE
Glucose, UA: NEGATIVE mg/dL
Hgb urine dipstick: NEGATIVE
Ketones, ur: NEGATIVE mg/dL
Leukocytes,Ua: NEGATIVE
Nitrite: NEGATIVE
Protein, ur: NEGATIVE mg/dL
Specific Gravity, Urine: 1.005 (ref 1.005–1.030)
Squamous Epithelial / HPF: NONE SEEN (ref 0–5)
pH: 8 (ref 5.0–8.0)

## 2020-11-25 LAB — CBC
HCT: 44.7 % (ref 39.0–52.0)
Hemoglobin: 15.2 g/dL (ref 13.0–17.0)
MCH: 30.3 pg (ref 26.0–34.0)
MCHC: 34 g/dL (ref 30.0–36.0)
MCV: 89.2 fL (ref 80.0–100.0)
Platelets: 245 10*3/uL (ref 150–400)
RBC: 5.01 MIL/uL (ref 4.22–5.81)
RDW: 12.7 % (ref 11.5–15.5)
WBC: 5 10*3/uL (ref 4.0–10.5)
nRBC: 0 % (ref 0.0–0.2)

## 2020-11-25 LAB — COMPREHENSIVE METABOLIC PANEL
ALT: 16 U/L (ref 0–44)
AST: 20 U/L (ref 15–41)
Albumin: 4.7 g/dL (ref 3.5–5.0)
Alkaline Phosphatase: 38 U/L (ref 38–126)
Anion gap: 9 (ref 5–15)
BUN: 14 mg/dL (ref 6–20)
CO2: 25 mmol/L (ref 22–32)
Calcium: 9.2 mg/dL (ref 8.9–10.3)
Chloride: 104 mmol/L (ref 98–111)
Creatinine, Ser: 1.05 mg/dL (ref 0.61–1.24)
GFR, Estimated: >60 mL/min (ref >60)
Glucose, Bld: 88 mg/dL (ref 70–99)
Potassium: 4 mmol/L (ref 3.5–5.1)
Sodium: 138 mmol/L (ref 135–145)
Total Bilirubin: 1.1 mg/dL (ref 0.3–1.2)
Total Protein: 7.5 g/dL (ref 6.5–8.1)

## 2020-11-25 LAB — LIPASE, BLOOD: Lipase: 28 U/L (ref 11–51)

## 2020-11-25 MED ORDER — IOHEXOL 300 MG/ML  SOLN
100.0000 mL | Freq: Once | INTRAMUSCULAR | Status: AC | PRN
  Administered 2020-11-25: 13:00:00 100 mL via INTRAVENOUS
  Filled 2020-11-25: qty 100

## 2020-11-25 NOTE — Discharge Instructions (Addendum)
Miralax:  Mix one tablespoon with 8oz of your favorite juice or water every day until you are having soft formed stools. Then start taking once daily if you didn't have a stool the day before.    You have been seen in the emergency department for emergency care. It is important that you contact your own doctor, specialist or the closest clinic for follow-up care. Please bring this instruction sheet, all medications and X-ray copies with you when you are seen for follow-up care.  Determining the exact cause for all patients with abdominal pain is extremely difficult in the emergency department. Our primary focus is to rule-out immediate life-threatening diseases. If no immediate source of pain is found the definitive diagnosis frequently needs to be determined over time.Many times your primary care physician can determine the cause by following the symptoms over time. Sometimes, specialist are required such as Gastroenterologists, Gynecologists, Urologists or Surgeons. Please return immediately to the Emergency Department for fever>101, Vomiting or Intractable Pain. You should return to the emergency department or see your primary care provider in 12-24hrs if your pain is no better and sooner if your pain becomes worse.

## 2020-11-25 NOTE — ED Triage Notes (Signed)
Pt has had abdominal pain for about a month and was seen by PCP 2 days ago and had an xray, he was contacted by PCP today and told to come to the ER for a CTscan.  Pt states that he has been constipated and feels fullness but has been able to eat and has not been in distress.

## 2020-11-25 NOTE — Telephone Encounter (Signed)
Discussed with Gerald Stabs (NP) at [facility]  Patient being referred to ER for concerns of ileus and need for CT imaging of abdomen pelvis. NP will be sending the patient to the ER for evaluation.

## 2020-11-25 NOTE — ED Provider Notes (Signed)
[facility] Emergency Department Provider Note    Event Date/Time   First MD Initiated Contact with Patient 11/25/20 1246     (approximate)  I have reviewed the triage vital signs and the nursing notes.   HISTORY  Chief Complaint Abdominal Pain    HPI Vincent Harris is a 39 y.o. male with the below listed past medical history presents to the ER for evaluation of 1 month of right-sided abdominal pain with recent outpatient abdominal x-ray being abnormal concerning for ileus and was sent to the ER.  This x-ray was performed on Friday.  States he been tolerating p.o. over the weekend.  Denies any nausea or vomiting.  States he has a history of constipation and has been making some food modifications for this.  He denies any chest pain.  No shortness of breath.  States the pain is only 1 out of 10.  Its been constant for 1 month.   Past Medical History:  Diagnosis Date  . Depression   . Squamous cell carcinoma of skin 08/30/2018   right central upper forehead    Family History  Problem Relation Age of Onset  . Mental illness Mother   . Diabetes Father   . Dementia Father   . Sleep apnea Father   . Arthritis Maternal Grandmother   . Arthritis Maternal Grandfather   . Diabetes Paternal Grandmother   . Diabetes Paternal Grandfather    History reviewed. No pertinent surgical history. Patient Active Problem List   Diagnosis Date Noted  . History of seizure 09/28/2016  . Tingling 08/24/2016  . Snoring 08/24/2016      Prior to Admission medications   Medication Sig Start Date End Date Taking? Authorizing Provider  fluticasone (FLONASE) 50 MCG/ACT nasal spray Place 2 sprays into both nostrils daily. Patient not taking: Reported on 05/10/2017 10/13/16   Wilhelmina Mcardle, MD    Allergies Percocet [oxycodone-acetaminophen]    Social History Social History   Tobacco Use  . Smoking status: Never Smoker  . Smokeless tobacco: Never Used  Substance  Use Topics  . Alcohol use: Yes    Comment: soical    Review of Systems Patient denies headaches, rhinorrhea, blurry vision, numbness, shortness of breath, chest pain, edema, cough, abdominal pain, nausea, vomiting, diarrhea, dysuria, fevers, rashes or hallucinations unless otherwise stated above in HPI. ____________________________________________   PHYSICAL EXAM:  VITAL SIGNS: Vitals:   11/25/20 1249 11/25/20 1300  BP: 116/77 122/69  Pulse: (!) 44   Resp: 14   Temp:    SpO2: 100%     Constitutional: Alert and oriented.  Eyes: Conjunctivae are normal.  Head: Atraumatic. Nose: No congestion/rhinnorhea. Mouth/Throat: Mucous membranes are moist.   Neck: No stridor. Painless ROM.  Cardiovascular: Normal rate, regular rhythm. Grossly normal heart sounds.  Good peripheral circulation. Respiratory: Normal respiratory effort.  No retractions. Lungs CTAB. Gastrointestinal: Soft and nontender. No distention. No abdominal bruits. No CVA tenderness. Genitourinary:  Musculoskeletal: No lower extremity tenderness nor edema.  No joint effusions. Neurologic:  Normal speech and language. No gross focal neurologic deficits are appreciated. No facial droop Skin:  Skin is warm, dry and intact. No rash noted. Psychiatric: Mood and affect are normal. Speech and behavior are normal.  ____________________________________________   LABS (all labs ordered are listed, but only abnormal results are displayed)  Results for orders placed or performed during the hospital encounter of 11/25/20 (from the past 24 hour(s))  Lipase, blood     Status: None  Collection Time: 11/25/20  9:45 AM  Result Value Ref Range   Lipase 28 11 - 51 U/L  Comprehensive metabolic panel     Status: None   Collection Time: 11/25/20  9:45 AM  Result Value Ref Range   Sodium 138 135 - 145 mmol/L   Potassium 4.0 3.5 - 5.1 mmol/L   Chloride 104 98 - 111 mmol/L   CO2 25 22 - 32 mmol/L   Glucose, Bld 88 70 - 99 mg/dL    BUN 14 6 - 20 mg/dL   Creatinine, Ser 1.05 0.61 - 1.24 mg/dL   Calcium 9.2 8.9 - 10.3 mg/dL   Total Protein 7.5 6.5 - 8.1 g/dL   Albumin 4.7 3.5 - 5.0 g/dL   AST 20 15 - 41 U/L   ALT 16 0 - 44 U/L   Alkaline Phosphatase 38 38 - 126 U/L   Total Bilirubin 1.1 0.3 - 1.2 mg/dL   GFR, Estimated >60 >60 mL/min   Anion gap 9 5 - 15  CBC     Status: None   Collection Time: 11/25/20  9:45 AM  Result Value Ref Range   WBC 5.0 4.0 - 10.5 K/uL   RBC 5.01 4.22 - 5.81 MIL/uL   Hemoglobin 15.2 13.0 - 17.0 g/dL   HCT 44.7 39.0 - 52.0 %   MCV 89.2 80.0 - 100.0 fL   MCH 30.3 26.0 - 34.0 pg   MCHC 34.0 30.0 - 36.0 g/dL   RDW 12.7 11.5 - 15.5 %   Platelets 245 150 - 400 K/uL   nRBC 0.0 0.0 - 0.2 %  Urinalysis, Complete w Microscopic     Status: Abnormal   Collection Time: 11/25/20  9:46 AM  Result Value Ref Range   Color, Urine STRAW (A) YELLOW   APPearance CLEAR (A) CLEAR   Specific Gravity, Urine 1.005 1.005 - 1.030   pH 8.0 5.0 - 8.0   Glucose, UA NEGATIVE NEGATIVE mg/dL   Hgb urine dipstick NEGATIVE NEGATIVE   Bilirubin Urine NEGATIVE NEGATIVE   Ketones, ur NEGATIVE NEGATIVE mg/dL   Protein, ur NEGATIVE NEGATIVE mg/dL   Nitrite NEGATIVE NEGATIVE   Leukocytes,Ua NEGATIVE NEGATIVE   RBC / HPF 0-5 0 - 5 RBC/hpf   WBC, UA 0-5 0 - 5 WBC/hpf   Bacteria, UA NONE SEEN NONE SEEN   Squamous Epithelial / LPF NONE SEEN 0 - 5   ____________________________________________  ____________________________________________  RADIOLOGY  I personally reviewed all radiographic images ordered to evaluate for the above acute complaints and reviewed radiology reports and findings.  These findings were personally discussed with the patient.  Please see medical record for radiology report.  ____________________________________________   PROCEDURES  Procedure(s) performed:  Procedures    Critical Care performed: no ____________________________________________   INITIAL IMPRESSION / ASSESSMENT AND  PLAN / ED COURSE  Pertinent labs & imaging results that were available during my care of the patient were reviewed by me and considered in my medical decision making (see chart for details).   DDX: Ileus, obstruction, colitis, IBD, appendicitis, stone, cystitis  Dianne Bady is a 39 y.o. who presents to the ED with symptoms as described above patient presenting with several weeks of abdominal pain as described above with outpatient abdominal x-ray concerning for ileus and sent to the ER for CT imaging. Blood work is reassuring. Abdominal exam soft and benign. CT imaging without evidence of acute process. Results and findings discussed with patient.  Do not see any indication for hospitalization at  this time. Does appear appropriate for close outpatient follow-up.  Have discussed with the patient and available family all diagnostics and treatments performed thus far and all questions were answered to the best of my ability. The patient demonstrates understanding and agreement with plan.      The patient was evaluated in Emergency Department today for the symptoms described in the history of present illness. He/she was evaluated in the context of the global COVID-19 pandemic, which necessitated consideration that the patient might be at risk for infection with the SARS-CoV-2 virus that causes COVID-19. Institutional protocols and algorithms that pertain to the evaluation of patients at risk for COVID-19 are in a state of rapid change based on information released by regulatory bodies including the CDC and federal and state organizations. These policies and algorithms were followed during the patient's care in the ED.  As part of my medical decision making, I reviewed the following data within the [facility] notes reviewed and incorporated, Labs reviewed, notes from prior ED visits and [place] Controlled Substance Database   ____________________________________________   FINAL  CLINICAL IMPRESSION(S) / ED DIAGNOSES  Final diagnoses:  Generalized abdominal pain      NEW MEDICATIONS STARTED DURING THIS VISIT:  New Prescriptions   No medications on file     Note:  This document was prepared using Dragon voice recognition software and may include unintentional dictation errors.    Merlyn Lot, MD 11/25/20 1406

## 2020-11-25 NOTE — ED Notes (Signed)
Pt to ED for right mid abdominal pain x4 weeks. States has had constipation hx but still going. Went to PCP for xray and was sent for CT scan.  Ambulatory to treatment room, NAD

## 2020-11-26 LAB — URINE CULTURE: Culture: NO GROWTH

## 2020-12-29 ENCOUNTER — Encounter: Payer: Self-pay | Admitting: Gastroenterology

## 2020-12-29 ENCOUNTER — Ambulatory Visit (INDEPENDENT_AMBULATORY_CARE_PROVIDER_SITE_OTHER): Payer: 59 | Admitting: Gastroenterology

## 2020-12-29 ENCOUNTER — Other Ambulatory Visit: Payer: Self-pay

## 2020-12-29 VITALS — BP 100/65 | HR 79 | Temp 98.3°F | Ht 70.0 in | Wt 189.0 lb

## 2020-12-29 DIAGNOSIS — K7689 Other specified diseases of liver: Secondary | ICD-10-CM

## 2020-12-29 DIAGNOSIS — G8929 Other chronic pain: Secondary | ICD-10-CM | POA: Diagnosis not present

## 2020-12-29 DIAGNOSIS — R194 Change in bowel habit: Secondary | ICD-10-CM | POA: Diagnosis not present

## 2020-12-29 DIAGNOSIS — R1031 Right lower quadrant pain: Secondary | ICD-10-CM | POA: Diagnosis not present

## 2020-12-29 MED ORDER — PEG 3350-KCL-NA BICARB-NACL 420 G PO SOLR: 4000.0000 mL | Freq: Once | ORAL | 0 refills | Status: AC

## 2020-12-29 NOTE — Progress Notes (Signed)
Vincent Bellows MD, MRCP(U.K) [address]  [place]  [place], [place] [address]  Main: [phone]  Fax: [phone]   Gastroenterology Consultation  Referring Provider:     Martin Harris, * Primary Care Physician:  Vincent Majestic, FNP Primary Gastroenterologist:  Dr. Jonathon Harris  Reason for Consultation:     Emergency room follow-up        HPI:   Vincent Harris is a 40 y.o. y/o male referred for consultation & management  by  Vincent Majestic, FNP.    He presented to the emergency room on 11/25/2020 with right-sided abdominal pain of 1 month duration.  At that point had been having issues with constipation.  He underwent a CT scan of the abdomen which showed no acute inflammatory process or abdominal process.  Bladder wall thickening was noted, hypodense lesion noted in the liver cyst versus hemangioma.  Further evaluation with right upper quadrant ultrasound was recommended  A CT scan of the abdomen in May 2021 showed no abnormalities.  11/25/2020: CBC, urinalysis, lipase, CMP normal.  He states that for the past 2 months he has had right lower quadrant discomfort throughout the day, describes it as a dull discomfort.  Nonradiating.  No clear aggravating or relieving factors.  He has noticed a change in his bowel habits as well over the past 2 months.  Not having a sensation of complete evacuation.  He has been consuming reasonable amount of fiber in his diet.  Denies any change in the shape of his stool.  No weight loss.  No blood in his stool.  No family history of colon cancer or polyps.  No prior GI evaluation.  Past Medical History:  Diagnosis Date  . Depression   . Squamous cell carcinoma of skin 08/30/2018   right central upper forehead     No past surgical history on file.  Prior to Admission medications   Medication Sig Start Date End Date Taking? Authorizing Provider  fluticasone (FLONASE) 50 MCG/ACT nasal spray Place 2 sprays into both  nostrils daily. Patient not taking: Reported on 05/10/2017 10/13/16   Wilhelmina Mcardle, MD    Family History  Problem Relation Age of Onset  . Mental illness Mother   . Diabetes Father   . Dementia Father   . Sleep apnea Father   . Arthritis Maternal Grandmother   . Arthritis Maternal Grandfather   . Diabetes Paternal Grandmother   . Diabetes Paternal Grandfather      Social History   Tobacco Use  . Smoking status: Never Smoker  . Smokeless tobacco: Never Used  Substance Use Topics  . Alcohol use: Yes    Comment: soical    Allergies as of 12/29/2020 - Review Complete 11/25/2020  Allergen Reaction Noted  . Percocet [oxycodone-acetaminophen] Other (See Comments) 08/24/2016    Review of Systems:    All systems reviewed and negative except where noted in HPI.   Physical Exam:  There were no vitals taken for this visit. No LMP for male patient. Psych:  Alert and cooperative. Normal mood and affect. General:   Alert,  Well-developed, well-nourished, pleasant and cooperative in NAD Head:  Normocephalic and atraumatic. Eyes:  Sclera clear, no icterus.   Conjunctiva pink. Ears:  Normal auditory acuity. Lungs:  Respirations even and unlabored.  Clear throughout to auscultation.   No wheezes, crackles, or rhonchi. No acute distress. Heart:  Regular rate and rhythm; no murmurs, clicks, rubs, or gallops. Abdomen:  Normal bowel sounds.  No bruits.  Soft, non-tender and non-distended without masses, hepatosplenomegaly or hernias noted.  No guarding or rebound tenderness.    Neurologic:  Alert and oriented x3;  grossly normal neurologically. Psych:  Alert and cooperative. Normal mood and affect.  Imaging Studies: No results found.  Assessment and Plan:   Vincent Harris is a 40 y.o. y/o male has been referred for chronic abdominal pain.  Recent CT scan of the abdomen December 2021 demonstrated a cyst in the liver.  Otherwise no gross abnormalities.  Some thickening of the bladder  wall.  Plan 1.  Right upper quadrant ultrasound to evaluate liver cyst 2.  High-fiber diet patient information will be provided 3.  Diagnostic colonoscopy to evaluate change in bowel habits. 4.  IBgard samples to be provided to treat functional pain. 5.  After the colonoscopy if he is pain has resolved after the colon prep that would indicate that possible constipation is part of the etiology and we can treat that.  Other options would be a pain modulator for functional pain  I have discussed alternative options, risks & benefits,  which include, but are not limited to, bleeding, infection, perforation,respiratory complication & drug reaction.  The patient agrees with this plan & written consent will be obtained.     Follow up in 8 weeks telephone visit  Dr Vincent Bellows MD,MRCP(U.K)

## 2020-12-29 NOTE — Patient Instructions (Signed)
High-Fiber Eating Plan Fiber, also called dietary fiber, is a type of carbohydrate. It is found foods such as fruits, vegetables, whole grains, and beans. A high-fiber diet can have many health benefits. Your health care provider may recommend a high-fiber diet to help:  Prevent constipation. Fiber can make your bowel movements more regular.  Lower your cholesterol.  Relieve the following conditions: ? Inflammation of veins in the anus (hemorrhoids). ? Inflammation of specific areas of the digestive tract (uncomplicated diverticulosis). ? A problem of the large intestine, also called the colon, that sometimes causes pain and diarrhea (irritable bowel syndrome, or IBS).  Prevent overeating as part of a weight-loss plan.  Prevent heart disease, type 2 diabetes, and certain cancers. What are tips for following this plan? Reading food labels  Check the nutrition facts label on food products for the amount of dietary fiber. Choose foods that have 5 grams of fiber or more per serving.  The goals for recommended daily fiber intake include: ? Men (age 50 or younger): 34-38 g. ? Men (over age 50): 28-34 g. ? Women (age 50 or younger): 25-28 g. ? Women (over age 50): 22-25 g. Your daily fiber goal is _____________ g.   Shopping  Choose whole fruits and vegetables instead of processed forms, such as apple juice or applesauce.  Choose a wide variety of high-fiber foods such as avocados, lentils, oats, and kidney beans.  Read the nutrition facts label of the foods you choose. Be aware of foods with added fiber. These foods often have high sugar and sodium amounts per serving. Cooking  Use whole-grain flour for baking and cooking.  Cook with brown rice instead of white rice. Meal planning  Start the day with a breakfast that is high in fiber, such as a cereal that contains 5 g of fiber or more per serving.  Eat breads and cereals that are made with whole-grain flour instead of refined  flour or white flour.  Eat brown rice, bulgur wheat, or millet instead of white rice.  Use beans in place of meat in soups, salads, and pasta dishes.  Be sure that half of the grains you eat each day are whole grains. General information  You can get the recommended daily intake of dietary fiber by: ? Eating a variety of fruits, vegetables, grains, nuts, and beans. ? Taking a fiber supplement if you are not able to take in enough fiber in your diet. It is better to get fiber through food than from a supplement.  Gradually increase how much fiber you consume. If you increase your intake of dietary fiber too quickly, you may have bloating, cramping, or gas.  Drink plenty of water to help you digest fiber.  Choose high-fiber snacks, such as berries, raw vegetables, nuts, and popcorn. What foods should I eat? Fruits Berries. Pears. Apples. Oranges. Avocado. Prunes and raisins. Dried figs. Vegetables Sweet potatoes. Spinach. Kale. Artichokes. Cabbage. Broccoli. Cauliflower. Green peas. Carrots. Squash. Grains Whole-grain breads. Multigrain cereal. Oats and oatmeal. Brown rice. Barley. Bulgur wheat. Millet. Quinoa. Bran muffins. Popcorn. Rye wafer crackers. Meats and other proteins Navy beans, kidney beans, and pinto beans. Soybeans. Split peas. Lentils. Nuts and seeds. Dairy Fiber-fortified yogurt. Beverages Fiber-fortified soy milk. Fiber-fortified orange juice. Other foods Fiber bars. The items listed above may not be a complete list of recommended foods and beverages. Contact a dietitian for more information. What foods should I avoid? Fruits Fruit juice. Cooked, strained fruit. Vegetables Fried potatoes. Canned vegetables. Well-cooked vegetables. Grains   White bread. Pasta made with refined flour. White rice. Meats and other proteins Fatty cuts of meat. Fried chicken or fried fish. Dairy Milk. Yogurt. Cream cheese. Sour cream. Fats and oils Butters. Beverages Soft  drinks. Other foods Cakes and pastries. The items listed above may not be a complete list of foods and beverages to avoid. Talk with your dietitian about what choices are best for you. Summary  Fiber is a type of carbohydrate. It is found in foods such as fruits, vegetables, whole grains, and beans.  A high-fiber diet has many benefits. It can help to prevent constipation, lower blood cholesterol, aid weight loss, and reduce your risk of heart disease, diabetes, and certain cancers.  Increase your intake of fiber gradually. Increasing fiber too quickly may cause cramping, bloating, and gas. Drink plenty of water while you increase the amount of fiber you consume.  The best sources of fiber include whole fruits and vegetables, whole grains, nuts, seeds, and beans. This information is not intended to replace advice given to you by your health care provider. Make sure you discuss any questions you have with your health care provider. Document Revised: 04/01/2020 Document Reviewed: 04/01/2020 Elsevier Patient Education  2021 Elsevier Inc.  

## 2020-12-29 NOTE — Progress Notes (Signed)
Ibgard samples given TODAY.

## 2021-01-04 ENCOUNTER — Ambulatory Visit
Admission: RE | Admit: 2021-01-04 | Discharge: 2021-01-04 | Disposition: A | Discharge status: Home / Self Care | Payer: 59 | Source: Ambulatory Visit | Attending: Gastroenterology | Admitting: Gastroenterology

## 2021-01-04 ENCOUNTER — Other Ambulatory Visit: Payer: Self-pay

## 2021-01-04 DIAGNOSIS — K7689 Other specified diseases of liver: Secondary | ICD-10-CM | POA: Diagnosis present

## 2021-01-14 ENCOUNTER — Telehealth: Payer: Self-pay

## 2021-01-14 ENCOUNTER — Other Ambulatory Visit
Admission: RE | Admit: 2021-01-14 | Discharge: 2021-01-14 | Disposition: A | Discharge status: Home / Self Care | Payer: 59 | Source: Ambulatory Visit | Attending: Gastroenterology | Admitting: Gastroenterology

## 2021-01-14 ENCOUNTER — Other Ambulatory Visit: Payer: Self-pay

## 2021-01-14 DIAGNOSIS — Z01812 Encounter for preprocedural laboratory examination: Secondary | ICD-10-CM | POA: Insufficient documentation

## 2021-01-14 DIAGNOSIS — Z20822 Contact with and (suspected) exposure to covid-19: Secondary | ICD-10-CM | POA: Insufficient documentation

## 2021-01-14 NOTE — Telephone Encounter (Signed)
Patient would like to know if he can have his colonoscopy without anesthesia. And if a polyp needs to be removed how much discomfort he will experience without anesthesia.   Please advise regarding patients request not to have anesthesia during his colonoscopy.  Thanks,  [place], [place]

## 2021-01-15 LAB — SARS CORONAVIRUS 2 (TAT 6-24 HRS): SARS Coronavirus 2: NEGATIVE

## 2021-01-16 NOTE — Telephone Encounter (Signed)
Do not recommend colonoscopy without anesthesia- could be painful  Enquire why he wants without anesthesia- if insists can give him minimal so that he can be awake but yet slightly drowsy

## 2021-01-17 ENCOUNTER — Encounter: Payer: Self-pay | Admitting: Gastroenterology

## 2021-01-18 ENCOUNTER — Ambulatory Visit: Payer: 59 | Admitting: Anesthesiology

## 2021-01-18 ENCOUNTER — Ambulatory Visit
Admission: RE | Admit: 2021-01-18 | Discharge: 2021-01-18 | Disposition: A | Discharge status: Home / Self Care | Payer: 59 | Attending: Gastroenterology | Admitting: Gastroenterology

## 2021-01-18 ENCOUNTER — Telehealth: Payer: Self-pay

## 2021-01-18 ENCOUNTER — Other Ambulatory Visit: Payer: Self-pay

## 2021-01-18 ENCOUNTER — Encounter
Admission: RE | Disposition: A | Discharge status: Home / Self Care | Payer: Self-pay | Source: Home / Self Care | Attending: Gastroenterology

## 2021-01-18 ENCOUNTER — Encounter: Payer: Self-pay | Admitting: Gastroenterology

## 2021-01-18 DIAGNOSIS — D125 Benign neoplasm of sigmoid colon: Secondary | ICD-10-CM | POA: Insufficient documentation

## 2021-01-18 DIAGNOSIS — K635 Polyp of colon: Secondary | ICD-10-CM | POA: Diagnosis not present

## 2021-01-18 DIAGNOSIS — Z885 Allergy status to narcotic agent status: Secondary | ICD-10-CM | POA: Diagnosis not present

## 2021-01-18 DIAGNOSIS — D120 Benign neoplasm of cecum: Secondary | ICD-10-CM | POA: Diagnosis not present

## 2021-01-18 DIAGNOSIS — Z8261 Family history of arthritis: Secondary | ICD-10-CM | POA: Insufficient documentation

## 2021-01-18 DIAGNOSIS — R194 Change in bowel habit: Secondary | ICD-10-CM

## 2021-01-18 DIAGNOSIS — Z833 Family history of diabetes mellitus: Secondary | ICD-10-CM | POA: Diagnosis not present

## 2021-01-18 DIAGNOSIS — K5900 Constipation, unspecified: Secondary | ICD-10-CM | POA: Insufficient documentation

## 2021-01-18 HISTORY — DX: Sleep apnea, unspecified: G47.30

## 2021-01-18 HISTORY — PX: COLONOSCOPY WITH PROPOFOL: SHX5780

## 2021-01-18 SURGERY — COLONOSCOPY WITH PROPOFOL
Anesthesia: General

## 2021-01-18 MED ORDER — PROPOFOL 500 MG/50ML IV EMUL
INTRAVENOUS | Status: DC | PRN
  Administered 2021-01-18: 200 ug/kg/min via INTRAVENOUS

## 2021-01-18 MED ORDER — PROPOFOL 10 MG/ML IV BOLUS
INTRAVENOUS | Status: DC | PRN
  Administered 2021-01-18: 100 mg via INTRAVENOUS
  Administered 2021-01-18: 30 mg via INTRAVENOUS

## 2021-01-18 MED ORDER — LIDOCAINE 2% (20 MG/ML) 5 ML SYRINGE
INTRAMUSCULAR | Status: DC | PRN
  Administered 2021-01-18: 50 mg via INTRAVENOUS

## 2021-01-18 MED ORDER — SODIUM CHLORIDE 0.9 % IV SOLN: INTRAVENOUS | Status: DC

## 2021-01-18 MED ORDER — EPHEDRINE SULFATE 50 MG/ML IJ SOLN
INTRAMUSCULAR | Status: DC | PRN
  Administered 2021-01-18: 10 mg via INTRAVENOUS

## 2021-01-18 NOTE — Transfer of Care (Signed)
Immediate Anesthesia Transfer of Care Note  Patient: Vincent Harris  Procedure(s) Performed: COLONOSCOPY WITH PROPOFOL (N/A )  Patient Location: Endoscopy Unit  Anesthesia Type:General  Level of Consciousness: sedated  Airway & Oxygen Therapy: Patient Spontanous Breathing  Post-op Assessment: Post -op Vital signs reviewed and stable  Post vital signs: stable  Last Vitals:  Vitals Value Taken Time  BP 92/58 01/18/21 1023  Temp    Pulse 56 01/18/21 1023  Resp 14 01/18/21 1023  SpO2 98 % 01/18/21 1023  Vitals shown include unvalidated device data.  Last Pain:  Vitals:   01/18/21 0915  TempSrc: Temporal  PainSc:          Complications: No complications documented.

## 2021-01-18 NOTE — Telephone Encounter (Signed)
-----   Message from Jonathon Bellows, MD sent at 01/16/2021 12:31 PM EST ----- Inform   Liver cysts are benign appearing, small gall bladder polyp noted which is benign per appearance

## 2021-01-18 NOTE — Telephone Encounter (Signed)
Called and notified patient of results regarding liver cyst. Pt verbalized understanding.

## 2021-01-18 NOTE — Op Note (Signed)
[facility] Gastroenterology Patient Name: Vincent Harris Procedure Date: 01/18/2021 9:38 AM MRN: [id number] Account #: [id number] Date of Birth: [birth year] Admit Type: Outpatient Age: 40 Room: [facility] ENDO ROOM 2 Gender: Male Note Status: Finalized Procedure:             Colonoscopy Indications:           Constipation Providers:             Jonathon Bellows MD, MD Referring MD:          Lorie Apley. Jimmye Norman (Referring MD) Medicines:             Monitored Anesthesia Care Complications:         No immediate complications. Procedure:             Pre-Anesthesia Assessment:                        - Prior to the procedure, a History and Physical was                         performed, and patient medications, allergies and                         sensitivities were reviewed. The patient's tolerance                         of previous anesthesia was reviewed.                        - The risks and benefits of the procedure and the                         sedation options and risks were discussed with the                         patient. All questions were answered and informed                         consent was obtained.                        - ASA Grade Assessment: II - A patient with mild                         systemic disease.                        - Prior to the procedure, a History and Physical was                         performed, and patient medications, allergies and                         sensitivities were reviewed. The patient's tolerance                         of previous anesthesia was reviewed.                        - The risks and benefits of the procedure and the  sedation options and risks were discussed with the                         patient. All questions were answered and informed                         consent was obtained.                        After obtaining informed consent, the colonoscope was                          passed under direct vision. Throughout the procedure,                         the patient's blood pressure, pulse, and oxygen                         saturations were monitored continuously. The                         Colonoscope was introduced through the anus and                         advanced to the the cecum, identified by the                         appendiceal orifice. The colonoscopy was performed                         with ease. The colonoscopy was somewhat difficult due                         to significant looping. Successful completion of the                         procedure was aided by applying abdominal pressure.                         The patient tolerated the procedure well. The quality                         of the bowel preparation was adequate. Findings:      The perianal and digital rectal examinations were normal.      Two sessile polyps were found in the sigmoid colon and cecum. The polyps       were 6 to 8 mm in size. These polyps were removed with a cold snare.       Resection and retrieval were complete.      The exam was otherwise without abnormality on direct and retroflexion       views. Impression:            - Two 6 to 8 mm polyps in the sigmoid colon and in the                         cecum, removed with a cold snare. Resected and  retrieved.                        - The examination was otherwise normal on direct and                         retroflexion views. Recommendation:        - Discharge patient to home (with escort).                        - Resume previous diet.                        - Continue present medications.                        - Await pathology results.                        - Repeat colonoscopy for surveillance based on                         pathology results.                        - Return to GI office as previously scheduled. Procedure Code(s):     --- Professional ---                         [id number], Colonoscopy, flexible; with removal of                         tumor(s), polyp(s), or other lesion(s) by snare                         technique Diagnosis Code(s):     --- Professional ---                        K63.5, Polyp of colon                        K59.00, Constipation, unspecified CPT copyright 2019 American Medical Association. All rights reserved. The codes documented in this report are preliminary and upon coder review may  be revised to meet current compliance requirements. Jonathon Bellows, MD Jonathon Bellows MD, MD 01/18/2021 10:23:45 AM This report has been signed electronically. Number of Addenda: 0 Note Initiated On: 01/18/2021 9:38 AM Scope Withdrawal Time: 0 hours 12 minutes 21 seconds  Total Procedure Duration: 0 hours 23 minutes 40 seconds  Estimated Blood Loss:  Estimated blood loss: none.      [facility]

## 2021-01-18 NOTE — H&P (Signed)
° ° ° °  Jonathon Bellows, MD [address] [place], [place], [place], [address] [address], [place], [place], [place], [address] Phone: [phone]  Fax: [phone]  Primary Care Physician:  Martin Majestic, FNP   Pre-Procedure History & Physical: HPI:  Vincent Harris is a 40 y.o. male is here for an colonoscopy.   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 No pertinent surgical history.  Prior to Admission medications   Medication Sig Start Date End Date Taking? Authorizing Provider  Multiple Vitamin (MULTIVITAMIN) tablet Take 1 tablet by mouth daily.    [provider]    Allergies as of 12/29/2020 - Review Complete 12/29/2020  Allergen Reaction Noted   Percocet [oxycodone-acetaminophen] Other (See Comments) 08/24/2016    Family History  Problem Relation Age of Onset   Mental illness Mother    Diabetes Father    Dementia Father    Sleep apnea Father    Arthritis Maternal Grandmother    Arthritis Maternal Grandfather    Diabetes Paternal Grandmother    Diabetes Paternal Grandfather     Social History   Socioeconomic History   Marital status: Single    Spouse name: Not on file   Number of children: Not on file   Years of education: Not on file   Highest education level: Not on file  Occupational History   Not on file  Tobacco Use   Smoking status: Never Smoker   Smokeless tobacco: Never Used  Substance and Sexual Activity   Alcohol use: Yes    Comment: soical   Drug use: Never   Sexual activity: Not on file  Other Topics Concern   Not on file  Social History Narrative   Married.   2 daughters.   Self Employed.   Enjoys racing cars, spending time with family.   Social Determinants of Health   Financial Resource Strain: Not on file  Food Insecurity: Not on file  Transportation Needs:  Not on file  Physical Activity: Not on file  Stress: Not on file  Social Connections: Not on file  Intimate Partner Violence: Not on file    Review of Systems: See HPI, otherwise negative ROS  Physical Exam: BP 101/62    Pulse (!) 49    Temp (!) 96.8 F (36 C) (Temporal)    Resp 15    Ht 5\' 10"  (1.778 m)    Wt 83.5 kg    SpO2 99%    BMI 26.40 kg/m  General:   Alert,  pleasant and cooperative in NAD Head:  Normocephalic and atraumatic. Neck:  Supple; no masses or thyromegaly. Lungs:  Clear throughout to auscultation, normal respiratory effort.    Heart:  +S1, +S2, Regular rate and rhythm, No edema. Abdomen:  Soft, nontender and nondistended. Normal bowel sounds, without guarding, and without rebound.   Neurologic:  Alert and  oriented x4;  grossly normal neurologically.  Impression/Plan: Vincent Harris is here for an colonoscopy to be performed for constipation. Risks, benefits, limitations, and alternatives regarding  colonoscopy have been reviewed with the patient.  Questions have been answered.  All parties agreeable.   Jonathon Bellows, MD  01/18/2021, 11:37 AM

## 2021-01-18 NOTE — Anesthesia Preprocedure Evaluation (Addendum)
Anesthesia Evaluation  Patient identified by MRN, date of birth, ID band Patient awake    Reviewed: Allergy & Precautions, H&P , NPO status , Patient's Chart, lab work & pertinent test results  History of Anesthesia Complications Negative for: history of anesthetic complications  Airway Mallampati: II  TM Distance: >3 FB     Dental  (+) Teeth Intact   Pulmonary neg pulmonary ROS, neg sleep apnea, neg COPD,    breath sounds clear to auscultation       Cardiovascular (-) angina(-) Past MI and (-) Cardiac Stents negative cardio ROS  (-) dysrhythmias  Rhythm:regular Rate:Normal     Neuro/Psych PSYCHIATRIC DISORDERS Depression negative neurological ROS     GI/Hepatic negative GI ROS, Neg liver ROS,   Endo/Other  negative endocrine ROS  Renal/GU negative Renal ROS  negative genitourinary   Musculoskeletal   Abdominal   Peds  Hematology negative hematology ROS (+)   Anesthesia Other Findings Past Medical History: No date: Depression 08/30/2018: Squamous cell carcinoma of skin     Comment:  right central upper forehead   History reviewed. No pertinent surgical history.  BMI    Body Mass Index: 26.40 kg/m      Reproductive/Obstetrics negative OB ROS                            Anesthesia Physical Anesthesia Plan  ASA: II  Anesthesia Plan: General   Post-op Pain Management:    Induction:   PONV Risk Score and Plan: Propofol infusion and TIVA  Airway Management Planned: Nasal Cannula  Additional Equipment:   Intra-op Plan:   Post-operative Plan:   Informed Consent: I have reviewed the patients History and Physical, chart, labs and discussed the procedure including the risks, benefits and alternatives for the proposed anesthesia with the patient or authorized representative who has indicated his/her understanding and acceptance.     Dental Advisory Given  Plan Discussed  with: Anesthesiologist, CRNA and Surgeon  Anesthesia Plan Comments:         Anesthesia Quick Evaluation

## 2021-01-19 ENCOUNTER — Encounter: Payer: Self-pay | Admitting: Gastroenterology

## 2021-01-19 LAB — SURGICAL PATHOLOGY

## 2021-01-19 NOTE — Anesthesia Postprocedure Evaluation (Signed)
Anesthesia Post Note  Patient: Vincent Harris  Procedure(s) Performed: COLONOSCOPY WITH PROPOFOL (N/A )  Patient location during evaluation: PACU Anesthesia Type: General Level of consciousness: awake and alert Pain management: pain level controlled Vital Signs Assessment: post-procedure vital signs reviewed and stable Respiratory status: spontaneous breathing, nonlabored ventilation and respiratory function stable Cardiovascular status: blood pressure returned to baseline and stable Postop Assessment: no apparent nausea or vomiting Anesthetic complications: no   No complications documented.   Last Vitals:  Vitals:   01/18/21 1032 01/18/21 1042  BP: 100/62 101/62  Pulse: (!) 57 (!) 49  Resp: 12 15  Temp:    SpO2: 98% 99%    Last Pain:  Vitals:   01/18/21 1042  TempSrc:   PainSc: 0-No pain                 Brett Canales Atwell Mcdanel

## 2021-01-20 ENCOUNTER — Encounter: Payer: Self-pay | Admitting: Gastroenterology

## 2021-02-28 ENCOUNTER — Ambulatory Visit (INDEPENDENT_AMBULATORY_CARE_PROVIDER_SITE_OTHER): Payer: 59 | Admitting: Gastroenterology

## 2021-02-28 ENCOUNTER — Other Ambulatory Visit: Payer: Self-pay

## 2021-02-28 VITALS — BP 124/71 | HR 76 | Ht 70.0 in | Wt 188.0 lb

## 2021-02-28 DIAGNOSIS — G8929 Other chronic pain: Secondary | ICD-10-CM

## 2021-02-28 DIAGNOSIS — R1031 Right lower quadrant pain: Secondary | ICD-10-CM | POA: Diagnosis not present

## 2021-02-28 MED ORDER — DICYCLOMINE HCL 10 MG PO CAPS: 10.0000 mg | ORAL_CAPSULE | Freq: Three times a day (TID) | ORAL | 2 refills | Status: AC

## 2021-02-28 NOTE — Progress Notes (Signed)
   Jonathon Bellows MD, MRCP(U.K) [address]  [place]  [place], [place] [address]  Main: [phone]  Fax: [phone]   Primary Care Physician: Martin Majestic, FNP  Primary Gastroenterologist:  Dr. Jonathon Bellows   No chief complaint on file.   HPI: Vincent Harris is a 40 y.o. male    Summary of history :  Initially referred and seen on 12/29/2020 for abdominal pain of 1 month duration.  He has been having issues with constipation.  CT scan of the abdomen showed no acute abnormalities except a liver cyst.  Interval history  12/29/2020-02/28/2021   01/04/2021 : RUQ USG:   3 mm gallbladder polyp; no follow-up imaging recommended. 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  01/18/2021: colonoscopy : 2 polyps resected , tubular Clemon Chambers adenoma.  Since the colonoscopy he has had recurrence of pain in the right lower quadrant no clear aggravating or relieving factors.  Having regular bowel movements daily.  Has tried MiraLAX which increased the frequency of his bowel movements but did not decrease the pain.  Tried IBgard which did not help. Current Outpatient Medications  Medication Sig Dispense Refill  . Multiple Vitamin (MULTIVITAMIN) tablet Take 1 tablet by mouth daily.     No current facility-administered medications for this visit.   Facility-Administered Medications Ordered in Other Visits  Medication Dose Route Frequency Provider Last Rate Last Admin  . gadopentetate dimeglumine (MAGNEVIST) injection 17 mL  17 mL Intravenous Once PRN Sater, Nanine Means, MD        Allergies as of 02/28/2021 - Review Complete 01/18/2021  Allergen Reaction Noted  . Percocet [oxycodone-acetaminophen] Other (See Comments) 08/24/2016    ROS:  General: Negative for anorexia, weight loss, fever, chills, fatigue, weakness. ENT: Negative for hoarseness, difficulty swallowing , nasal congestion. CV: Negative for chest pain, angina,  palpitations, dyspnea on exertion, peripheral edema.  Respiratory: Negative for dyspnea at rest, dyspnea on exertion, cough, sputum, wheezing.  GI: See history of present illness. GU:  Negative for dysuria, hematuria, urinary incontinence, urinary frequency, nocturnal urination.  Endo: Negative for unusual weight change.    Physical Examination:   BP 124/71   Pulse 76   Ht 5\' 10"  (1.778 m)   Wt 188 lb (85.3 kg)   BMI 26.98 kg/m   General: Well-nourished, well-developed in no acute distress.  Eyes: No icterus. Conjunctivae pink. Abdomen: Bowel sounds are normal, nontender, nondistended, no hepatosplenomegaly or masses, no abdominal bruits or hernia , no rebound or guarding.   Neuro: Alert and oriented x 3.  Grossly intact. Skin: Warm and dry, no jaundice.   Psych: Alert and cooperative, normal mood and affect.   Imaging Studies: No results found.  Assessment and Plan:   Vincent Harris is a 40 y.o. y/o male  Here to follow up for chronic abdominal pain.    Right lower quadrant initially felt related to constipation but has not improved after treatment of constipation either.  Possibly has functional pain. Plan 1.    Commence on Bentyl if no better at next visit consider amitriptyline   Dr Jonathon Bellows  MD,MRCP [facility]) Follow up in 6 weeks video visit

## 2021-04-19 ENCOUNTER — Encounter: Payer: Self-pay | Admitting: Gastroenterology

## 2021-04-19 ENCOUNTER — Other Ambulatory Visit: Payer: Self-pay

## 2021-04-19 ENCOUNTER — Ambulatory Visit (INDEPENDENT_AMBULATORY_CARE_PROVIDER_SITE_OTHER): Payer: 59 | Admitting: Gastroenterology

## 2021-04-19 VITALS — BP 110/65 | HR 68 | Ht 70.0 in | Wt 187.4 lb

## 2021-04-19 DIAGNOSIS — R1031 Right lower quadrant pain: Secondary | ICD-10-CM

## 2021-04-19 DIAGNOSIS — G8929 Other chronic pain: Secondary | ICD-10-CM

## 2021-04-19 NOTE — Progress Notes (Signed)
Jonathon Bellows MD, MRCP(U.K) [address]  [place]  [place], [place] [address]  Main: [phone]  Fax: [phone]   Primary Care Physician: Martin Majestic, FNP  Primary Gastroenterologist:  Dr. Jonathon Bellows    Abdominal pain follow-up  HPI: Vincent Harris is a 40 y.o. male   Summary of history :  Initially referred and seen on 12/29/2020 for abdominal pain of 1 month duration.  He had been having issues with constipation.  CT scan of the abdomen showed no acute abnormalities except a liver cyst. 01/04/2021 : RUQ USG:   3 mm gallbladder polyp; no follow-up imaging recommended. 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  01/18/2021: colonoscopy : 2 polyps resected , tubular Clemon Chambers adenoma.  Since the colonoscopy he has had recurrence of pain in the right lower quadrant no clear aggravating or relieving factors.  Having regular bowel movements daily.  Has tried MiraLAX which increased the frequency of his bowel movements but did not decrease the pain.  Tried IBgard which did not help.   Interval history  02/28/2021-04/19/2021  He was doing well on a low FODMAP diet and then retreated 200lbs bag and stated pain from the right side of his back radiating to the front which was worse with gaseous distention.  Denies any constipation    Current Outpatient Medications  Medication Sig Dispense Refill  . Multiple Vitamin (MULTIVITAMIN) tablet Take 1 tablet by mouth daily.    Marland Kitchen dicyclomine (BENTYL) 10 MG capsule Take 1 capsule (10 mg total) by mouth 4 (four) times daily -  before meals and at bedtime. (Patient not taking: Reported on 04/19/2021) 120 capsule 2   No current facility-administered medications for this visit.   Facility-Administered Medications Ordered in Other Visits  Medication Dose Route Frequency Provider Last Rate Last Admin  . gadopentetate dimeglumine (MAGNEVIST) injection 17 mL  17 mL Intravenous Once  PRN Sater, Nanine Means, MD        Allergies as of 04/19/2021 - Review Complete 04/19/2021  Allergen Reaction Noted  . Percocet [oxycodone-acetaminophen] Other (See Comments) 08/24/2016     ROS:  General: Negative for anorexia, weight loss, fever, chills, fatigue, weakness. ENT: Negative for hoarseness, difficulty swallowing , nasal congestion. CV: Negative for chest pain, angina, palpitations, dyspnea on exertion, peripheral edema.  Respiratory: Negative for dyspnea at rest, dyspnea on exertion, cough, sputum, wheezing.  GI: See history of present illness. GU:  Negative for dysuria, hematuria, urinary incontinence, urinary frequency, nocturnal urination.  Endo: Negative for unusual weight change.    Physical Examination:   BP 110/65 (BP Location: Left Arm, Patient Position: Sitting, Cuff Size: Large)   Pulse 68   Ht 5\' 10"  (1.778 m)   Wt 187 lb 6.4 oz (85 kg)   BMI 26.89 kg/m   General: Well-nourished, well-developed in no acute distress.  Eyes: No icterus. Conjunctivae pink. Neuro: Alert and oriented x 3.  Grossly intact. Skin: Warm and dry, no jaundice.   Psych: Alert and cooperative, normal mood and affect.   Imaging Studies: No results found.  Assessment and Plan:   Vincent Harris is a 40 y.o. y/o male here to follow up for chronic abdominal pain.   Right lower quadrant initially felt related to constipation but has not improved after treatment of constipation either.  Possibly has functional pain.  Pain is worse with occasional distention and has been related to recent trauma.  Probably a component of gas related pain .  Plan 1.    Continue low FODMAP diet, advised to rest his back if there is a musculoskeletal pain from lifting heavy weights it could be exacerbated by gaseous distention.  Discussed about using a pain modulator such as amitriptyline but he is not too keen.    Dr Jonathon Bellows  MD,MRCP [facility]) Follow up in as needed

## 2021-10-06 IMAGING — US US ABDOMEN LIMITED
2 series · 14 of 25 positions shown · non-contrast
Comparison: CT abdomen and pelvis 11/25/2020

CLINICAL DATA: Liver cyst on CT

EXAM:
ULTRASOUND ABDOMEN LIMITED RIGHT UPPER QUADRANT

[Series 1: us abdomen limited ruq (liver/gb) · 8 of 72 slices shown]
[im 1/72]
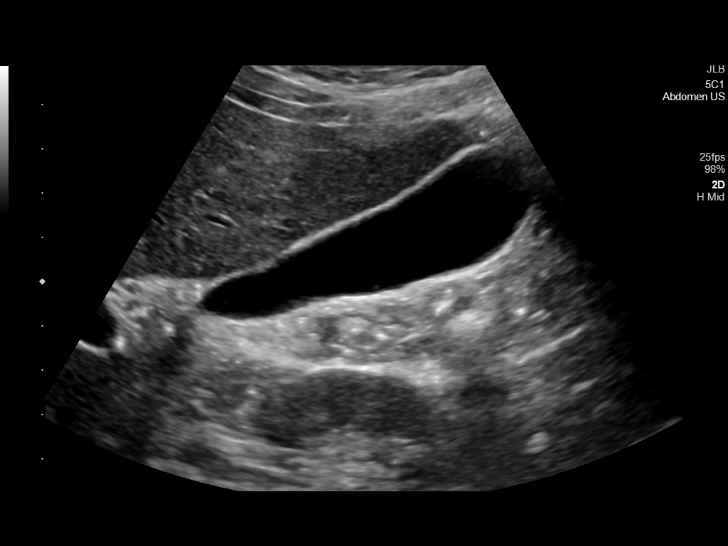
[im 11/72]
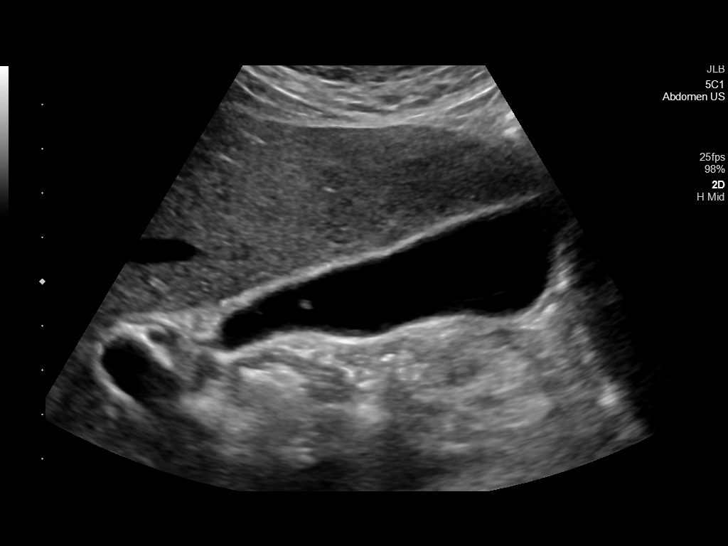
[im 21/72]
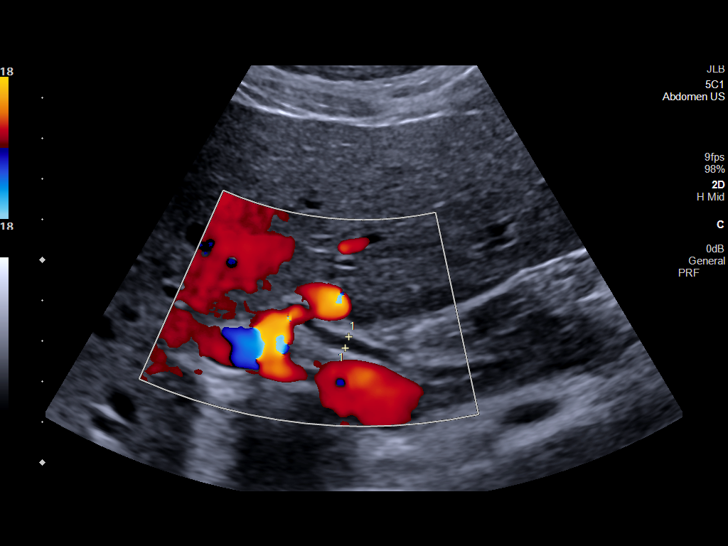
[im 31/72]
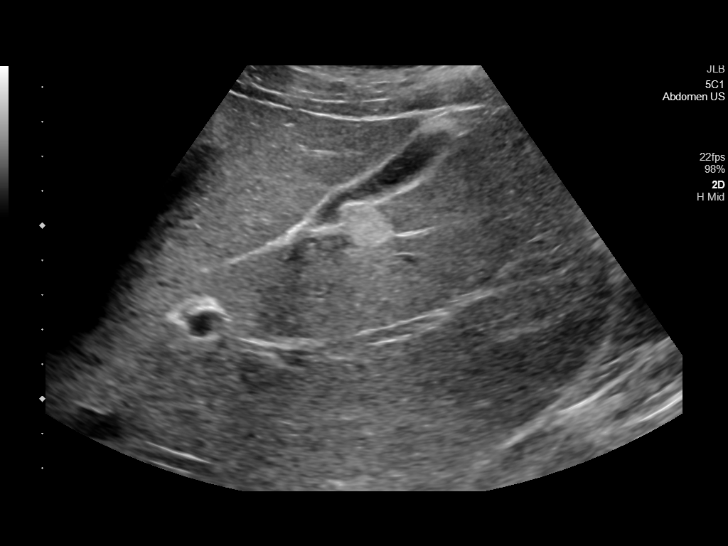
[im 41/72]
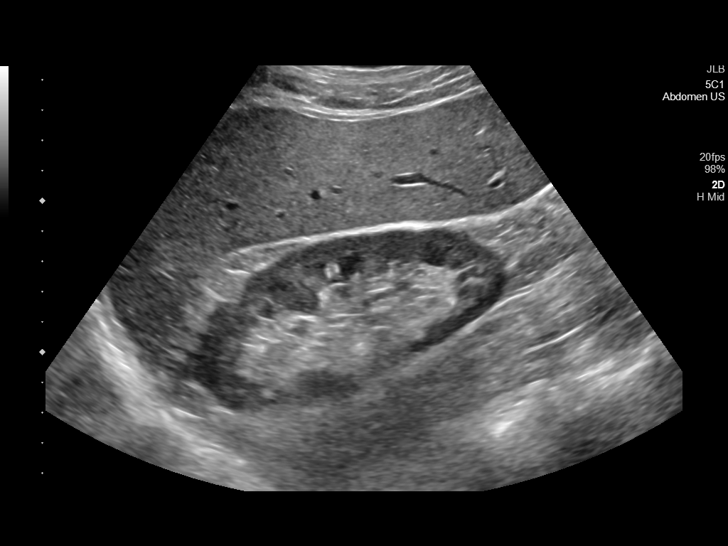
[im 46/72]
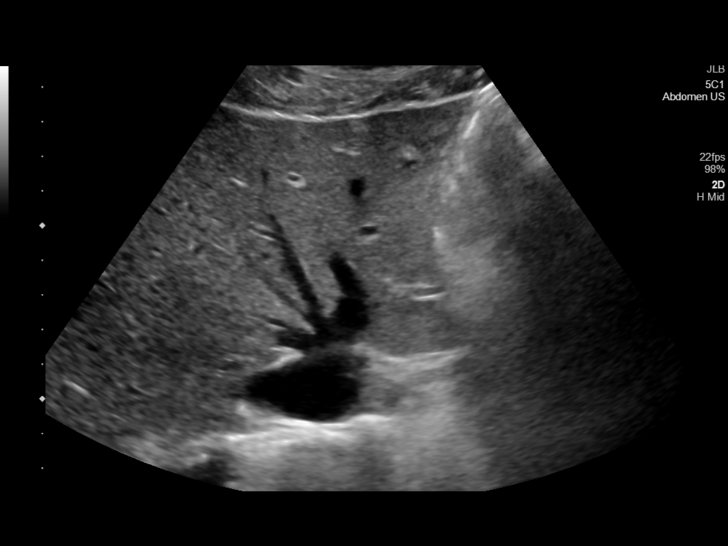
[im 56/72]
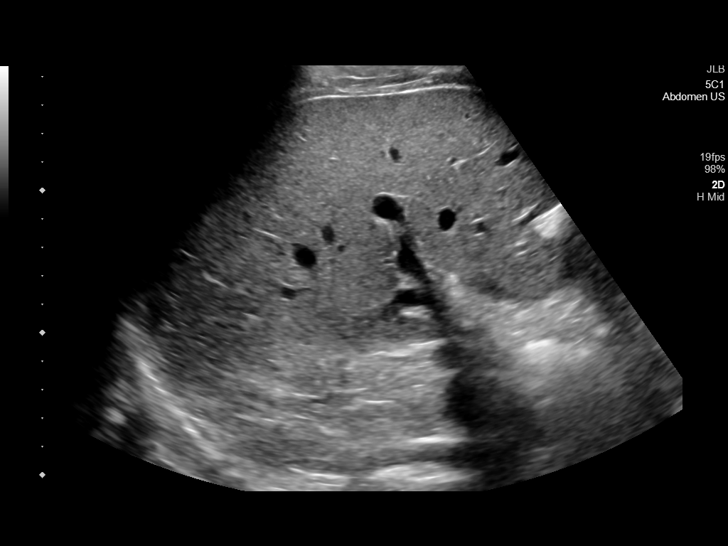
[im 66/72]
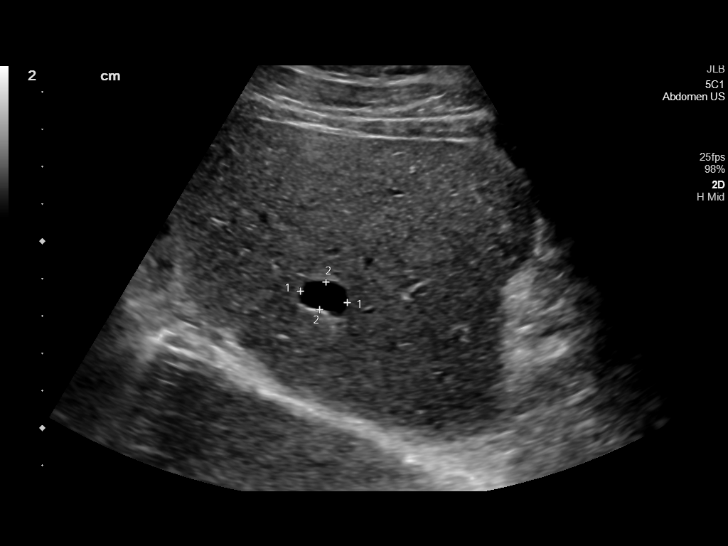

[Series 1001: abdomen us · 6 of 49 slices shown]
[im 1/49]
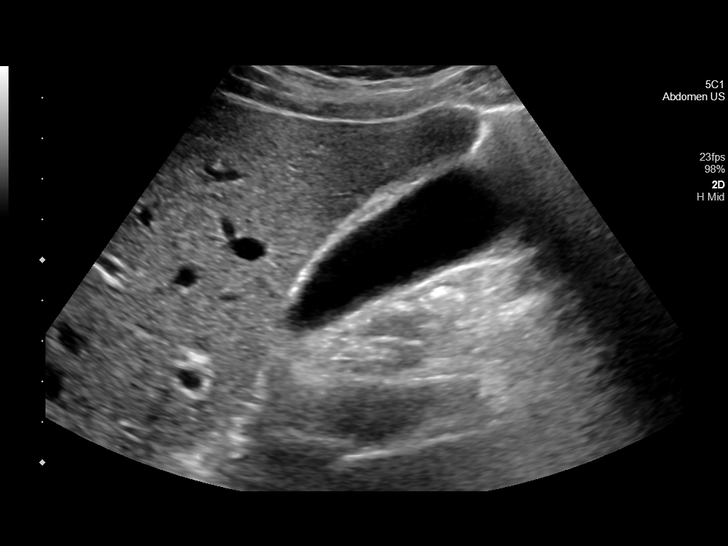
[im 6/49]
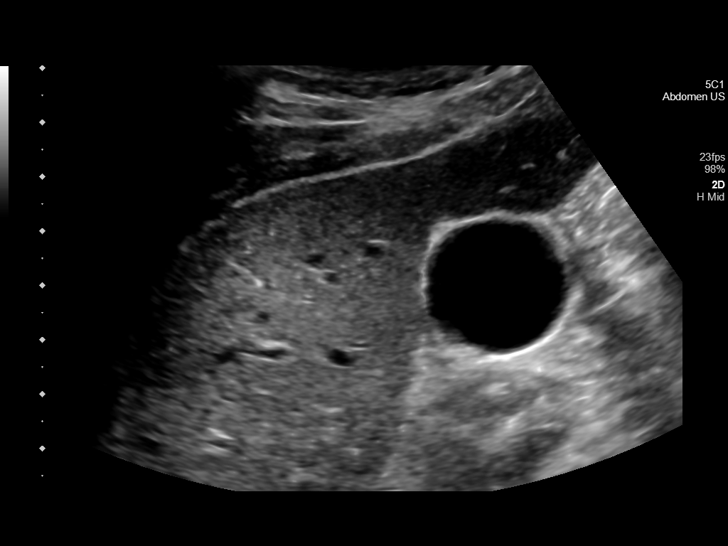
[im 17/49]
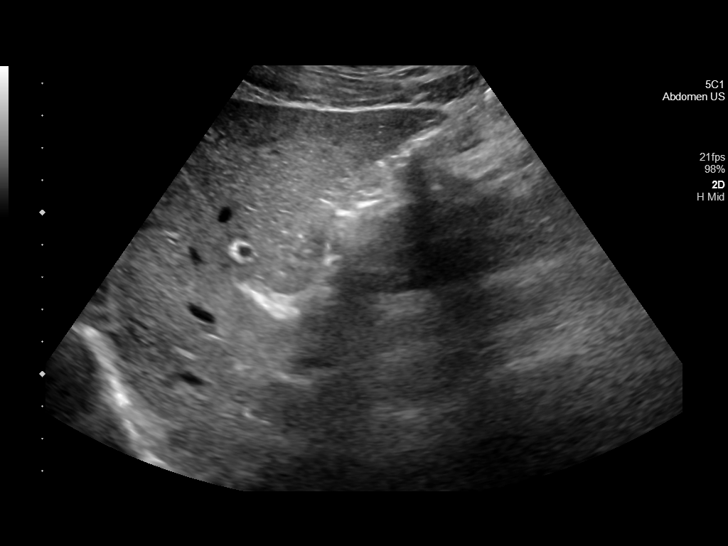
[im 27/49]
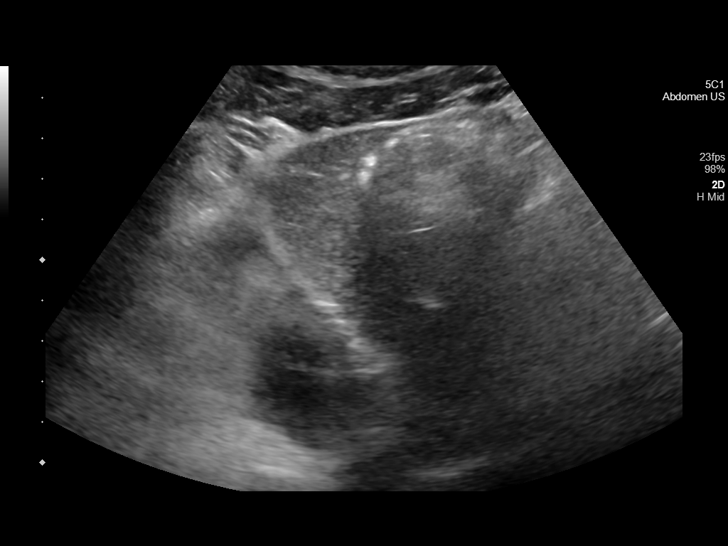
[im 38/49]
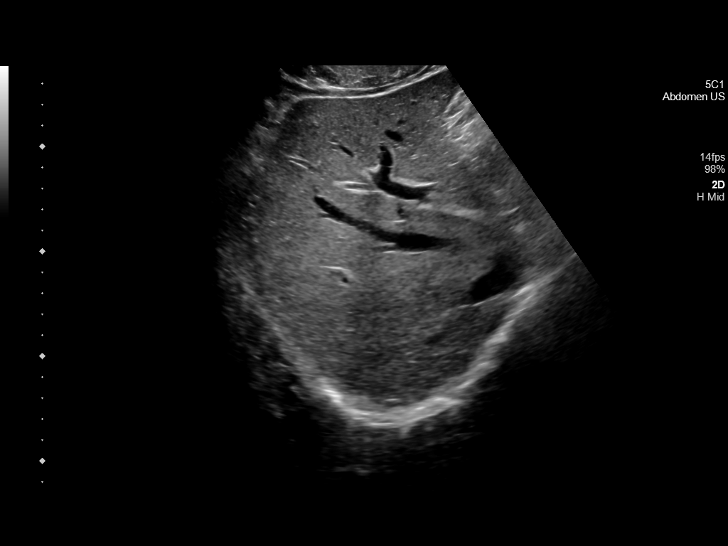
[im 49/49]
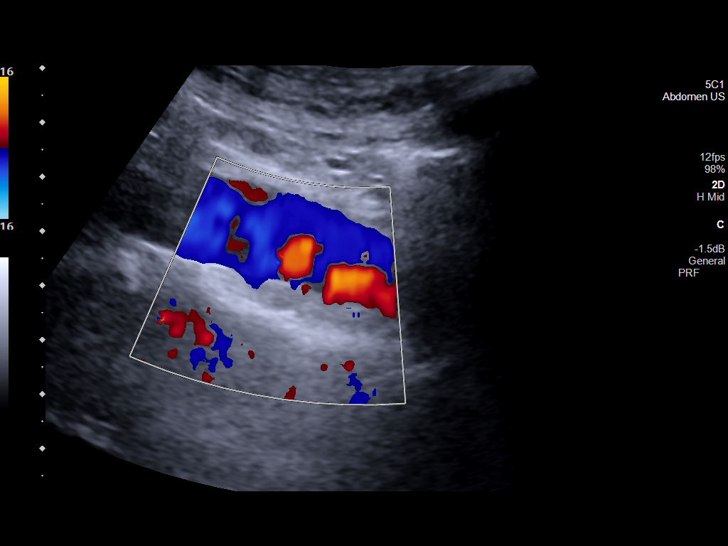

[14 of 25 positions shown; findings below may reference images not displayed]

FINDINGS: Gallbladder:

3 mm nonshadowing non mobile gallbladder polyp; no follow-up imaging
recommended. Gallbladder normally distended without stones or wall
thickening. No pericholecystic fluid or sonographic Murphy sign.

Common bile duct:

Diameter: 3 mm, normal

Liver:

Normal parenchymal echogenicity. Hyperechoic nodule RIGHT lobe
centrally 16 x 13 x 13 mm consistent with hemangioma. Additional
tiny cyst RIGHT lobe 13 x 8 x 14 mm. No additional hepatic masses or
contour nodularity. No intrahepatic biliary dilatation. Portal vein
is patent on color Doppler imaging with normal direction of blood
flow towards the liver.

Other: No RIGHT upper quadrant free fluid.
IMPRESSION: 3 mm gallbladder polyp; no follow-up imaging recommended.

16 mm hemangioma RIGHT lobe liver with additional tiny RIGHT lobe
cyst 14 mm diameter.

Remainder of exam normal.

## 2021-11-09 ENCOUNTER — Ambulatory Visit (INDEPENDENT_AMBULATORY_CARE_PROVIDER_SITE_OTHER): Payer: 59 | Admitting: Dermatology

## 2021-11-09 ENCOUNTER — Other Ambulatory Visit: Payer: Self-pay

## 2021-11-09 DIAGNOSIS — L810 Postinflammatory hyperpigmentation: Secondary | ICD-10-CM | POA: Diagnosis not present

## 2021-11-09 DIAGNOSIS — L578 Other skin changes due to chronic exposure to nonionizing radiation: Secondary | ICD-10-CM | POA: Diagnosis not present

## 2021-11-09 DIAGNOSIS — L700 Acne vulgaris: Secondary | ICD-10-CM

## 2021-11-09 DIAGNOSIS — Z1283 Encounter for screening for malignant neoplasm of skin: Secondary | ICD-10-CM

## 2021-11-09 DIAGNOSIS — L814 Other melanin hyperpigmentation: Secondary | ICD-10-CM

## 2021-11-09 DIAGNOSIS — D229 Melanocytic nevi, unspecified: Secondary | ICD-10-CM

## 2021-11-09 DIAGNOSIS — L821 Other seborrheic keratosis: Secondary | ICD-10-CM

## 2021-11-09 DIAGNOSIS — Z85828 Personal history of other malignant neoplasm of skin: Secondary | ICD-10-CM

## 2021-11-09 DIAGNOSIS — D1801 Hemangioma of skin and subcutaneous tissue: Secondary | ICD-10-CM

## 2021-11-09 NOTE — Patient Instructions (Addendum)
Recommend using Cln Acne Wash daily, leave on for 1-2 minutes before rinsing off. This can be purchased at [employer] or online.    If You Need Anything After Your Visit  If you have any questions or concerns for your doctor, please call our main line at [phone] and press option 4 to reach your doctor's medical assistant. If no one answers, please leave a voicemail as directed and we will return your call as soon as possible. Messages left after 4 pm will be answered the following business day.   You may also send [place] a message via [place]. We typically respond to MyChart messages within 1-2 business days.  For prescription refills, please ask your pharmacy to contact our office. Our fax number is [phone].  If you have an urgent issue when the clinic is closed that cannot wait until the next business day, you can page your doctor at the number below.    Please note that while we do our best to be available for urgent issues outside of office hours, we are not available 24/7.   If you have an urgent issue and are unable to reach [place], you may choose to seek medical care at your doctor's office, retail clinic, urgent care center, or emergency room.  If you have a medical emergency, please immediately call 911 or go to the emergency department.  Pager Numbers  - Dr. Nehemiah Massed: [phone]  - Dr. Laurence Ferrari: [phone]  - Dr. Nicole Kindred: [phone]  In the event of inclement weather, please call our main line at [phone] for an update on the status of any delays or closures.  Dermatology Medication Tips: Please keep the boxes that topical medications come in in order to help keep track of the instructions about where and how to use these. Pharmacies typically print the medication instructions only on the boxes and not directly on the medication tubes.   If your medication is too expensive, please contact our office at [phone] option 4 or send [place] a message through  [place].   We are unable to tell what your co-pay for medications will be in advance as this is different depending on your insurance coverage. However, we may be able to find a substitute medication at lower cost or fill out paperwork to get insurance to cover a needed medication.   If a prior authorization is required to get your medication covered by your insurance company, please allow [place] 1-2 business days to complete this process.  Drug prices often vary depending on where the prescription is filled and some pharmacies may offer cheaper prices.  The website www.goodrx.com contains coupons for medications through different pharmacies. The prices here do not account for what the cost may be with help from insurance (it may be cheaper with your insurance), but the website can give you the price if you did not use any insurance.  - You can print the associated coupon and take it with your prescription to the pharmacy.  - You may also stop by our office during regular business hours and pick up a GoodRx coupon card.  - If you need your prescription sent electronically to a different pharmacy, notify our office through [facility] or by phone at [phone] option 4.     Si Usted Necesita Algo Despus de Su Visita  Tambin puede enviarnos un mensaje a travs de Pharmacist, community. Por lo general respondemos a los mensajes de MyChart en el transcurso de 1 a 2 das hbiles.  Para renovar recetas, por favor pida a su farmacia que se ponga en contacto con nuestra oficina. Harland Dingwall de fax es Olivia Lopez de Gutierrez [phone].  Si tiene un asunto urgente cuando la clnica est cerrada y que no puede esperar hasta el siguiente da hbil, puede llamar/localizar a su doctor(a) al nmero que aparece a continuacin.   Por favor, tenga en cuenta que aunque hacemos todo lo posible para estar disponibles para asuntos urgentes fuera del horario de [place], no estamos disponibles las 24 horas del da, los 7 das de la  [place].   Si tiene un problema urgente y no puede comunicarse con nosotros, puede optar por buscar atencin mdica  en el consultorio de su doctor(a), en una clnica privada, en un centro de atencin urgente o en una sala de emergencias.  Si tiene Engineering geologist, por favor llame inmediatamente al 911 o vaya a la sala de emergencias.  Nmeros de bper  - Dr. Nehemiah Massed: [phone]  - Dra. Moye: [phone]  - Dra. Nicole Kindred: [phone]  En caso de inclemencias del [place], por favor llame a Johnsie Kindred principal al [phone] para una actualizacin sobre el [place] de cualquier retraso o cierre.  Consejos para la medicacin en dermatologa: Por favor, guarde las cajas en las que vienen los medicamentos de uso tpico para ayudarle a seguir las instrucciones sobre dnde y cmo usarlos. Las farmacias generalmente imprimen las instrucciones del medicamento slo en las cajas y no directamente en los tubos del [place].   Si su medicamento es muy caro, por favor, pngase en contacto con Zigmund Daniel llamando al [phone] y presione la opcin 4 o envenos un mensaje a travs de Pharmacist, community.   No podemos decirle cul ser su copago por los medicamentos por adelantado ya que esto es diferente dependiendo de la cobertura de su seguro. Sin embargo, es posible que podamos encontrar un medicamento sustituto a Electrical engineer un formulario para que el seguro cubra el medicamento que se considera necesario.   Si se requiere una autorizacin previa para que su compaa de seguros [place] su medicamento, por favor permtanos de 1 a 2 das hbiles para completar este proceso.  Los precios de los medicamentos varan con frecuencia dependiendo del Environmental consultant de dnde se surte la receta y alguna farmacias pueden ofrecer precios ms baratos.  El sitio web www.goodrx.com tiene cupones para medicamentos de Airline pilot. Los precios aqu no tienen en cuenta lo que podra costar con la ayuda del  seguro (puede ser ms barato con su seguro), pero el sitio web puede darle el precio si no utiliz Research scientist (physical sciences).  - Puede imprimir el cupn correspondiente y llevarlo con su receta a la farmacia.  - Tambin puede pasar por nuestra oficina durante el horario de atencin regular y Charity fundraiser una tarjeta de cupones de GoodRx.  - Si necesita que su receta se enve electrnicamente a una farmacia diferente, informe a nuestra oficina a travs de MyChart de [place] o por telfono llamando al [phone] y presione la opcin 4.

## 2021-11-09 NOTE — Progress Notes (Signed)
   Follow-Up Visit   Subjective  Vincent Harris is a 40 y.o. male who presents for the following: Annual Exam (Mole check ). 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 Check right flank hx boils treated with antibiotics several months ago.  The patient presents for Upper Body Skin Exam (UBSE) for skin cancer screening and mole check. The patient has spots, moles and lesions to be evaluated, some may be new or changing and the patient has concerns that these could be cancer.  The following portions of the chart were reviewed this encounter and updated as appropriate:   Tobacco  Allergies  Meds  Problems  Med Hx  Surg Hx  Fam Hx     Review of Systems:  No other skin or systemic complaints except as noted in HPI or Assessment and Plan.  Objective  Well appearing patient in no apparent distress; mood and affect are within normal limits.  All skin waist up examined.  back Erythematous papules and pustules with comedones   right hip and groin Resolving abscess   Assessment & Plan  Acne vulgaris back Chronic and persistent   Recommend using Cln Acne Wash daily, leave on for 1-2 minutes before rinsing off. This can be purchased at [employer] or online.   Post-inflammatory hyperpigmentation right hip and groin Resolving Abscess with PIPA Recommend using Cln Acne Wash daily, leave on for 1-2 minutes before rinsing off. This can be purchased at [employer] or online.   Skin cancer screening  Lentigines - Scattered tan macules - Due to sun exposure - Benign-appearing, observe - Recommend daily broad spectrum sunscreen SPF 30+ to sun-exposed areas, reapply every 2 hours as needed. - Call for any changes  Seborrheic Keratoses - Stuck-on, waxy, tan-brown papules and/or plaques  - Benign-appearing - Discussed benign etiology and prognosis. - Observe - Call for any changes  Melanocytic Nevi - Tan-brown and/or pink-flesh-colored symmetric macules and  papules - Benign appearing on exam today - Observation - Call clinic for new or changing moles - Recommend daily use of broad spectrum spf 30+ sunscreen to sun-exposed areas.   Hemangiomas - Red papules - Discussed benign nature - Observe - Call for any changes  Actinic Damage - Chronic condition, secondary to cumulative UV/sun exposure - diffuse scaly erythematous macules with underlying dyspigmentation - Recommend daily broad spectrum sunscreen SPF 30+ to sun-exposed areas, reapply every 2 hours as needed.  - Staying in the shade or wearing long sleeves, sun glasses (UVA+UVB protection) and wide brim hats (4-inch brim around the entire circumference of the hat) are also recommended for sun protection.  - Call for new or changing lesions.  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  - Call if any new or changing lesions are noted between office visits  Skin cancer screening performed today.   Return in about 1 year (around 11/09/2022) for UBSE, hx of SCC.  IMarye Round, CMA, am acting as scribe for Sarina Ser, MD .  Documentation: I have reviewed the above documentation for accuracy and completeness, and I agree with the above.  Sarina Ser, MD

## 2021-11-16 ENCOUNTER — Encounter: Payer: Self-pay | Admitting: Dermatology

## 2022-10-18 ENCOUNTER — Encounter: Payer: Self-pay | Admitting: Dermatology

## 2022-10-18 ENCOUNTER — Ambulatory Visit (INDEPENDENT_AMBULATORY_CARE_PROVIDER_SITE_OTHER): Payer: Commercial Managed Care - PPO | Admitting: Dermatology

## 2022-10-18 DIAGNOSIS — L739 Follicular disorder, unspecified: Secondary | ICD-10-CM

## 2022-10-18 MED ORDER — DOXYCYCLINE MONOHYDRATE 100 MG PO CAPS: 100.0000 mg | ORAL_CAPSULE | Freq: Two times a day (BID) | ORAL | 0 refills | Status: AC

## 2022-10-18 NOTE — Progress Notes (Signed)
   Follow-Up Visit   Subjective  Vincent Harris is a 41 y.o. male who presents for the following: Rash (C/O folliculitis flare on back. Dur: 6 months. Similar issue last year. Was told to use CLN Acne Wash. Started using again for this flare. Spreading. Was seen by PCP few months ago, gave round of oral antibiotics, no help. Few weeks ago spread to other areas. Has been taking fluconazole for ~2 weeks now. Has stopped spreading).   The following portions of the chart were reviewed this encounter and updated as appropriate:  Tobacco  Allergies  Meds  Problems  Med Hx  Surg Hx  Fam Hx      Review of Systems: No other skin or systemic complaints except as noted in HPI or Assessment and Plan.   Objective  Well appearing patient in no apparent distress; mood and affect are within normal limits.  A focused examination was performed including back. Relevant physical exam findings are noted in the Assessment and Plan.  Back Multiple perifollicular erythematous papules and nearly resolved pustules   Assessment & Plan  Folliculitis Back  C/w Bacterial folliculitis  Start Doxycycline '100mg'$  one capsule twice daily for 10 days  Doxycycline should be taken with food to prevent nausea. Do not lay down for 30 minutes after taking. Be cautious with sun exposure and use good sun protection while on this medication. Pregnant women should not take this medication.   Can discontinue Fluconazole.   Continue CLN Sport wash at least once weekly for maintenance.   Message or call if not resolved in 1-2 weeks  doxycycline (MONODOX) 100 MG capsule - Back Take 1 capsule (100 mg total) by mouth 2 (two) times daily for 10 days. Take with food   No follow-ups on file.  I, Emelia Salisbury, CMA, am acting as scribe for Forest Gleason, MD.  Documentation: I have reviewed the above documentation for accuracy and completeness, and I agree with the above.  Forest Gleason, MD

## 2022-10-18 NOTE — Patient Instructions (Addendum)
Start Doxycycline '100mg'$  one capsule twice daily for 10 days. Take with food.  Avoid dairy products and Tums 1 hour before and 1 hour after taking Doxycycline.   Can discontinue Fluconazole.   Continue CLN Acne wash at least once weekly for maintenance.   Doxycycline should be taken with food to prevent nausea. Do not lay down for 30 minutes after taking. Be cautious with sun exposure and use good sun protection while on this medication. Pregnant women should not take this medication.    Recommend taking Heliocare sun protection supplement daily in sunny weather for additional sun protection. For maximum protection on the sunniest days, you can take up to 2 capsules of regular Heliocare OR take 1 capsule of Heliocare Ultra. For prolonged exposure (such as a full day in the sun), you can repeat your dose of the supplement 4 hours after your first dose. Heliocare can be purchased at [employer], at some [facility] or at [email or web address].     Due to recent changes in healthcare laws, you may see results of your pathology and/or laboratory studies on MyChart before the doctors have had a chance to review them. We understand that in some cases there may be results that are confusing or concerning to you. Please understand that not all results are received at the same time and often the doctors may need to interpret multiple results in order to provide you with the best plan of care or course of treatment. Therefore, we ask that you please give [place] 2 business days to thoroughly review all your results before contacting the office for clarification. Should we see a critical lab result, you will be contacted sooner.   If You Need Anything After Your Visit  If you have any questions or concerns for your doctor, please call our main line at [phone] and press option 4 to reach your doctor's medical assistant. If no one answers, please leave a voicemail as directed and we will return your call as  soon as possible. Messages left after 4 pm will be answered the following business day.   You may also send [place] a message via [place]. We typically respond to MyChart messages within 1-2 business days.  For prescription refills, please ask your pharmacy to contact our office. Our fax number is [phone].  If you have an urgent issue when the clinic is closed that cannot wait until the next business day, you can page your doctor at the number below.    Please note that while we do our best to be available for urgent issues outside of office hours, we are not available 24/7.   If you have an urgent issue and are unable to reach [place], you may choose to seek medical care at your doctor's office, retail clinic, urgent care center, or emergency room.  If you have a medical emergency, please immediately call 911 or go to the emergency department.  Pager Numbers  - Dr. Nehemiah Massed: [phone]  - Dr. Laurence Ferrari: [phone]  - Dr. Nicole Kindred: [phone]  In the event of inclement weather, please call our main line at [phone] for an update on the status of any delays or closures.  Dermatology Medication Tips: Please keep the boxes that topical medications come in in order to help keep track of the instructions about where and how to use these. Pharmacies typically print the medication instructions only on the boxes and not directly on the medication tubes.   If your medication is too expensive, please contact  our office at [phone] option 4 or send [place] a message through [place].   We are unable to tell what your co-pay for medications will be in advance as this is different depending on your insurance coverage. However, we may be able to find a substitute medication at lower cost or fill out paperwork to get insurance to cover a needed medication.   If a prior authorization is required to get your medication covered by your insurance company, please allow [place] 1-2 business days to complete this  process.  Drug prices often vary depending on where the prescription is filled and some pharmacies may offer cheaper prices.  The website www.goodrx.com contains coupons for medications through different pharmacies. The prices here do not account for what the cost may be with help from insurance (it may be cheaper with your insurance), but the website can give you the price if you did not use any insurance.  - You can print the associated coupon and take it with your prescription to the pharmacy.  - You may also stop by our office during regular business hours and pick up a GoodRx coupon card.  - If you need your prescription sent electronically to a different pharmacy, notify our office through [facility] or by phone at [phone] option 4.     Si Usted Necesita Algo Despus de Su Visita  Tambin puede enviarnos un mensaje a travs de Pharmacist, community. Por lo general respondemos a los mensajes de MyChart en el transcurso de 1 a 2 das hbiles.  Para renovar recetas, por favor pida a su farmacia que se ponga en contacto con nuestra oficina. Harland Dingwall de fax es [place] [phone].  Si tiene un asunto urgente cuando la clnica est cerrada y que no puede esperar hasta el siguiente da hbil, puede llamar/localizar a su doctor(a) al nmero que aparece a continuacin.   Por favor, tenga en cuenta que aunque hacemos todo lo posible para estar disponibles para asuntos urgentes fuera del horario de [place], no estamos disponibles las 24 horas del da, los 7 das de la [place].   Si tiene un problema urgente y no puede comunicarse con nosotros, puede optar por buscar atencin mdica  en el consultorio de su doctor(a), en una clnica privada, en un centro de atencin urgente o en una sala de emergencias.  Si tiene Engineering geologist, por favor llame inmediatamente al 911 o vaya a la sala de emergencias.  Nmeros de bper  - Dr. Nehemiah Massed: [phone]  - Dra. Moye: [phone]  - Dra.  Nicole Kindred: [phone]  En caso de inclemencias del [place], por favor llame a Johnsie Kindred principal al [phone] para una actualizacin sobre el [place] de cualquier retraso o cierre.  Consejos para la medicacin en dermatologa: Por favor, guarde las cajas en las que vienen los medicamentos de uso tpico para ayudarle a seguir las instrucciones sobre dnde y cmo usarlos. Las farmacias generalmente imprimen las instrucciones del medicamento slo en las cajas y no directamente en los tubos del [place].   Si su medicamento es muy caro, por favor, pngase en contacto con Zigmund Daniel llamando al [phone] y presione la opcin 4 o envenos un mensaje a travs de Pharmacist, community.   No podemos decirle cul ser su copago por los medicamentos por adelantado ya que esto es diferente dependiendo de la cobertura de su seguro. Sin embargo, es posible que podamos encontrar un medicamento sustituto a Electrical engineer un formulario para que el seguro cubra el medicamento  que se considera necesario.   Si se requiere una autorizacin previa para que su compaa de seguros [place] su medicamento, por favor permtanos de 1 a 2 das hbiles para completar este proceso.  Los precios de los medicamentos varan con frecuencia dependiendo del Environmental consultant de dnde se surte la receta y alguna farmacias pueden ofrecer precios ms baratos.  El sitio web www.goodrx.com tiene cupones para medicamentos de Airline pilot. Los precios aqu no tienen en cuenta lo que podra costar con la ayuda del seguro (puede ser ms barato con su seguro), pero el sitio web puede darle el precio si no utiliz Research scientist (physical sciences).  - Puede imprimir el cupn correspondiente y llevarlo con su receta a la farmacia.  - Tambin puede pasar por nuestra oficina durante el horario de atencin regular y Charity fundraiser una tarjeta de cupones de GoodRx.  - Si necesita que su receta se enve electrnicamente a una farmacia diferente, informe a nuestra oficina a  travs de MyChart de [place] o por telfono llamando al [phone] y presione la opcin 4.

## 2022-10-21 ENCOUNTER — Encounter: Payer: Self-pay | Admitting: Dermatology

## 2022-10-22 ENCOUNTER — Encounter: Payer: Self-pay | Admitting: Dermatology

## 2022-11-13 ENCOUNTER — Ambulatory Visit: Payer: 59 | Admitting: Dermatology

## 2022-12-18 ENCOUNTER — Ambulatory Visit: Payer: Commercial Managed Care - PPO | Admitting: Gastroenterology

## 2023-02-06 ENCOUNTER — Ambulatory Visit (INDEPENDENT_AMBULATORY_CARE_PROVIDER_SITE_OTHER): Payer: Commercial Managed Care - PPO | Admitting: Dermatology

## 2023-02-06 ENCOUNTER — Encounter: Payer: Self-pay | Admitting: Dermatology

## 2023-02-06 VITALS — BP 112/68

## 2023-02-06 DIAGNOSIS — L309 Dermatitis, unspecified: Secondary | ICD-10-CM

## 2023-02-06 DIAGNOSIS — L700 Acne vulgaris: Secondary | ICD-10-CM

## 2023-02-06 MED ORDER — TRIAMCINOLONE ACETONIDE 0.1 % EX CREA: TOPICAL_CREAM | CUTANEOUS | 1 refills | Status: AC

## 2023-02-06 MED ORDER — DOXYCYCLINE MONOHYDRATE 100 MG PO CAPS: 100.0000 mg | ORAL_CAPSULE | Freq: Two times a day (BID) | ORAL | 0 refills | Status: AC

## 2023-02-06 NOTE — Patient Instructions (Addendum)
Start triamcinolone 0.1% cream twice daily for up to 2 weeks as needed for rash. Avoid applying to face, groin, and axilla. Use as directed. Long-term use can cause thinning of the skin.  Topical steroids (such as triamcinolone, fluocinolone, fluocinonide, mometasone, clobetasol, halobetasol, betamethasone, hydrocortisone) can cause thinning and lightening of the skin if they are used for too long in the same area. Your physician has selected the right strength medicine for your problem and area affected on the body. Please use your medication only as directed by your physician to prevent side effects.   Recommend taking Heliocare sun protection supplement daily in sunny weather for additional sun protection. For maximum protection on the sunniest days, you can take up to 2 capsules of regular Heliocare OR take 1 capsule of Heliocare Ultra. For prolonged exposure (such as a full day in the sun), you can repeat your dose of the supplement 4 hours after your first dose. Heliocare can be purchased at [employer], at some [facility] or at [email or web address].    Start doxycycline monohydrate 100 mg twice daily x 1 week with food.  Start HibaClens wash from the neck down once daily x 1 week.   Doxycycline should be taken with food to prevent nausea. Do not lay down for 30 minutes after taking. Be cautious with sun exposure and use good sun protection while on this medication. Pregnant women should not take this medication.   Gentle Skin Care Guide  1. Bathe no more than once a day.  2. Avoid bathing in hot water  3. Use a mild soap like Dove, Vanicream, Cetaphil, CeraVe. Can use Lever 2000 or Cetaphil antibacterial soap  4. Use soap only where you need it. On most days, use it under your arms, between your legs, and on your feet. Let the water rinse other areas unless visibly dirty.  5. When you get out of the bath/shower, use a towel to gently blot your skin dry, don't rub it.  6. While your  skin is still a little damp, apply a moisturizing cream such as Vanicream, CeraVe, Cetaphil, Eucerin, Sarna lotion or plain Vaseline Jelly. For hands apply Neutrogena [place] Hand Cream or Excipial Hand Cream.  7. Reapply moisturizer any time you start to itch or feel dry.  8. Sometimes using free and clear laundry detergents can be helpful. Fabric softener sheets should be avoided. Downy Free & Gentle liquid, or any liquid fabric softener that is free of dyes and perfumes, it acceptable to use  9. If your doctor has given you prescription creams you may apply moisturizers over them   Due to recent changes in healthcare laws, you may see results of your pathology and/or laboratory studies on MyChart before the doctors have had a chance to review them. We understand that in some cases there may be results that are confusing or concerning to you. Please understand that not all results are received at the same time and often the doctors may need to interpret multiple results in order to provide you with the best plan of care or course of treatment. Therefore, we ask that you please give [place] 2 business days to thoroughly review all your results before contacting the office for clarification. Should we see a critical lab result, you will be contacted sooner.   If You Need Anything After Your Visit  If you have any questions or concerns for your doctor, please call our main line at [phone] and press option 4 to reach your doctor's  medical assistant. If no one answers, please leave a voicemail as directed and we will return your call as soon as possible. Messages left after 4 pm will be answered the following business day.   You may also send [place] a message via [place]. We typically respond to MyChart messages within 1-2 business days.  For prescription refills, please ask your pharmacy to contact our office. Our fax number is [phone].  If you have an urgent issue when the clinic is closed that  cannot wait until the next business day, you can page your doctor at the number below.    Please note that while we do our best to be available for urgent issues outside of office hours, we are not available 24/7.   If you have an urgent issue and are unable to reach [place], you may choose to seek medical care at your doctor's office, retail clinic, urgent care center, or emergency room.  If you have a medical emergency, please immediately call 911 or go to the emergency department.  Pager Numbers  - Dr. Nehemiah Massed: [phone]  - Dr. Laurence Ferrari: [phone]  - Dr. Nicole Kindred: [phone]  In the event of inclement weather, please call our main line at [phone] for an update on the status of any delays or closures.  Dermatology Medication Tips: Please keep the boxes that topical medications come in in order to help keep track of the instructions about where and how to use these. Pharmacies typically print the medication instructions only on the boxes and not directly on the medication tubes.   If your medication is too expensive, please contact our office at [phone] option 4 or send [place] a message through [place].   We are unable to tell what your co-pay for medications will be in advance as this is different depending on your insurance coverage. However, we may be able to find a substitute medication at lower cost or fill out paperwork to get insurance to cover a needed medication.   If a prior authorization is required to get your medication covered by your insurance company, please allow [place] 1-2 business days to complete this process.  Drug prices often vary depending on where the prescription is filled and some pharmacies may offer cheaper prices.  The website www.goodrx.com contains coupons for medications through different pharmacies. The prices here do not account for what the cost may be with help from insurance (it may be cheaper with your insurance), but the website can give you the  price if you did not use any insurance.  - You can print the associated coupon and take it with your prescription to the pharmacy.  - You may also stop by our office during regular business hours and pick up a GoodRx coupon card.  - If you need your prescription sent electronically to a different pharmacy, notify our office through [facility] or by phone at [phone] option 4.     Si Usted Necesita Algo Despus de Su Visita  Tambin puede enviarnos un mensaje a travs de Pharmacist, community. Por lo general respondemos a los mensajes de MyChart en el transcurso de 1 a 2 das hbiles.  Para renovar recetas, por favor pida a su farmacia que se ponga en contacto con nuestra oficina. Harland Dingwall de fax es [place] [phone].  Si tiene un asunto urgente cuando la clnica est cerrada y que no puede esperar hasta el siguiente da hbil, puede llamar/localizar a su doctor(a) al nmero que aparece a continuacin.   Por  favor, tenga en cuenta que aunque hacemos todo lo posible para estar disponibles para asuntos urgentes fuera del horario de [place], no estamos disponibles las 24 horas del da, los 7 das de la [place].   Si tiene un problema urgente y no puede comunicarse con nosotros, puede optar por buscar atencin mdica  en el consultorio de su doctor(a), en una clnica privada, en un centro de atencin urgente o en una sala de emergencias.  Si tiene Engineering geologist, por favor llame inmediatamente al 911 o vaya a la sala de emergencias.  Nmeros de bper  - Dr. Nehemiah Massed: [phone]  - Dra. Moye: [phone]  - Dra. Nicole Kindred: [phone]  En caso de inclemencias del [place], por favor llame a Johnsie Kindred principal al [phone] para una actualizacin sobre el [place] de cualquier retraso o cierre.  Consejos para la medicacin en dermatologa: Por favor, guarde las cajas en las que vienen los medicamentos de uso tpico para ayudarle a seguir las instrucciones sobre dnde y cmo  usarlos. Las farmacias generalmente imprimen las instrucciones del medicamento slo en las cajas y no directamente en los tubos del [place].   Si su medicamento es muy caro, por favor, pngase en contacto con Zigmund Daniel llamando al [phone] y presione la opcin 4 o envenos un mensaje a travs de Pharmacist, community.   No podemos decirle cul ser su copago por los medicamentos por adelantado ya que esto es diferente dependiendo de la cobertura de su seguro. Sin embargo, es posible que podamos encontrar un medicamento sustituto a Electrical engineer un formulario para que el seguro cubra el medicamento que se considera necesario.   Si se requiere una autorizacin previa para que su compaa de seguros [place] su medicamento, por favor permtanos de 1 a 2 das hbiles para completar este proceso.  Los precios de los medicamentos varan con frecuencia dependiendo del Environmental consultant de dnde se surte la receta y alguna farmacias pueden ofrecer precios ms baratos.  El sitio web www.goodrx.com tiene cupones para medicamentos de Airline pilot. Los precios aqu no tienen en cuenta lo que podra costar con la ayuda del seguro (puede ser ms barato con su seguro), pero el sitio web puede darle el precio si no utiliz Research scientist (physical sciences).  - Puede imprimir el cupn correspondiente y llevarlo con su receta a la farmacia.  - Tambin puede pasar por nuestra oficina durante el horario de atencin regular y Charity fundraiser una tarjeta de cupones de GoodRx.  - Si necesita que su receta se enve electrnicamente a una farmacia diferente, informe a nuestra oficina a travs de MyChart de [place] o por telfono llamando al [phone] y presione la opcin 4.

## 2023-02-06 NOTE — Progress Notes (Signed)
   Follow-Up Visit   Subjective  Vincent Harris is a 42 y.o. male who presents for the following: Rash (Patient has been seen for folliculitis and hives. Hives cleared up on their own. Folliculitis like rash came back up early this month and he restarted doxycycline. Patient is here today for an entirely different rash at neck and back. Rash does not itch and not too bothersome. ).  Patient noticed acne/folliculitis last summer. Did have acne as a teenager. Patient also has not worn deodorant in 5 years and has recently noticed that when he is the gym his body odor smells like bread.   The following portions of the chart were reviewed this encounter and updated as appropriate:   Tobacco  Allergies  Meds  Problems  Med Hx  Surg Hx  Fam Hx      Review of Systems:  No other skin or systemic complaints except as noted in HPI or Assessment and Plan.  Objective  Well appearing patient in no apparent distress; mood and affect are within normal limits.  A focused examination was performed including face, neck, chest and back. Relevant physical exam findings are noted in the Assessment and Plan.  face, chest and back Trace open comedones at face, chest and back Scattered inflammatory papules, most perifollicular but not all  neck Scaly pink papules coalescing to plaques     Assessment & Plan  Acne vulgaris face, chest and back   And folliculitis  Start doxycycline monohydrate 100 mg twice daily x 1 week with food.  Start HibaClens wash from the neck down once daily x 1 week.   Doxycycline should be taken with food to prevent nausea. Do not lay down for 30 minutes after taking. Be cautious with sun exposure and use good sun protection while on this medication. Pregnant women should not take this medication.    If hibiclens does not clear bread-like odor, consider blood work (CMP).    doxycycline (MONODOX) 100 MG capsule - face, chest and back Take 1 capsule (100 mg total)  by mouth 2 (two) times daily. Take with food  Related Procedures Anaerobic and Aerobic Culture  Dermatitis neck  Unclear etiology. He denies new skin care products, cleaning products or travel prior to onset.  Start TMC 0.1% cream twice daily for up to 2 weeks as needed for rash. Avoid applying to face, groin, and axilla. Use as directed. Long-term use can cause thinning of the skin.  Topical steroids (such as triamcinolone, fluocinolone, fluocinonide, mometasone, clobetasol, halobetasol, betamethasone, hydrocortisone) can cause thinning and lightening of the skin if they are used for too long in the same area. Your physician has selected the right strength medicine for your problem and area affected on the body. Please use your medication only as directed by your physician to prevent side effects.   Recommend gentle skin care.   Recheck at f/u  triamcinolone cream (KENALOG) 0.1 % - neck Apply to affected areas of rash twice daily for up to 2 weeks as needed. Avoid applying to face, groin, and axilla. Use as directed. Long-term use can cause thinning of the skin.   Return for 4-6 weeks.  Graciella Belton, RMA, am acting as scribe for Forest Gleason, MD .  Documentation: I have reviewed the above documentation for accuracy and completeness, and I agree with the above.  Forest Gleason, MD

## 2023-02-12 LAB — ANAEROBIC AND AEROBIC CULTURE

## 2023-02-14 ENCOUNTER — Telehealth: Payer: Self-pay

## 2023-02-14 NOTE — Telephone Encounter (Signed)
Patient advised culture did not grow any bacteria. He has not noticed any change but has not started taking antibiotic due to sun exposure.  Lurlean Horns., RMA

## 2023-02-14 NOTE — Telephone Encounter (Signed)
-----   Message from Alfonso Patten, MD sent at 02/13/2023 10:12 PM EST ----- Culture did not grow any bacteria. Please check how his rash is doing and let me know.   MAs please call. Thank you!

## 2023-03-21 ENCOUNTER — Ambulatory Visit: Payer: Commercial Managed Care - PPO | Admitting: Dermatology
